# Patient Record
Sex: Female | Born: 1955 | Hispanic: Yes | Marital: Married | Smoking: Never smoker
Health system: Southern US, Community
[De-identification: ages and names within clinical notes are randomized; demographics above are authoritative.]

## PROBLEM LIST (undated history)

## (undated) DIAGNOSIS — I10 Essential (primary) hypertension: Secondary | ICD-10-CM

## (undated) DIAGNOSIS — E785 Hyperlipidemia, unspecified: Secondary | ICD-10-CM

## (undated) DIAGNOSIS — R87619 Unspecified abnormal cytological findings in specimens from cervix uteri: Secondary | ICD-10-CM

## (undated) HISTORY — DX: Unspecified abnormal cytological findings in specimens from cervix uteri: R87.619

---

## 2001-01-03 ENCOUNTER — Ambulatory Visit (HOSPITAL_COMMUNITY): Admission: RE | Admit: 2001-01-03 | Discharge: 2001-01-03 | Payer: Self-pay | Admitting: Interventional Radiology

## 2001-01-19 ENCOUNTER — Ambulatory Visit (HOSPITAL_COMMUNITY): Admission: RE | Admit: 2001-01-19 | Discharge: 2001-01-19 | Payer: Self-pay | Admitting: Interventional Radiology

## 2003-03-17 ENCOUNTER — Encounter: Admission: RE | Admit: 2003-03-17 | Discharge: 2003-03-17 | Payer: Self-pay | Admitting: *Deleted

## 2003-04-07 ENCOUNTER — Encounter: Admission: RE | Admit: 2003-04-07 | Discharge: 2003-04-07 | Payer: Self-pay | Admitting: Obstetrics and Gynecology

## 2003-04-07 ENCOUNTER — Other Ambulatory Visit: Admission: RE | Admit: 2003-04-07 | Discharge: 2003-04-07 | Payer: Self-pay | Admitting: Family Medicine

## 2003-07-22 ENCOUNTER — Encounter: Admission: RE | Admit: 2003-07-22 | Discharge: 2003-07-22 | Payer: Self-pay | Admitting: Family Medicine

## 2004-06-07 ENCOUNTER — Ambulatory Visit: Payer: Self-pay | Admitting: Obstetrics and Gynecology

## 2010-04-14 ENCOUNTER — Encounter: Payer: Self-pay | Admitting: Family Medicine

## 2010-04-14 ENCOUNTER — Encounter: Payer: Self-pay | Admitting: Interventional Radiology

## 2012-05-21 ENCOUNTER — Encounter (HOSPITAL_COMMUNITY): Payer: Self-pay | Admitting: *Deleted

## 2012-05-28 ENCOUNTER — Other Ambulatory Visit: Payer: Self-pay | Admitting: Obstetrics and Gynecology

## 2012-06-09 ENCOUNTER — Ambulatory Visit (HOSPITAL_COMMUNITY)
Admission: RE | Admit: 2012-06-09 | Discharge: 2012-06-09 | Disposition: A | Discharge status: Home / Self Care | Payer: Self-pay | Source: Ambulatory Visit | Attending: Obstetrics and Gynecology | Admitting: Obstetrics and Gynecology

## 2012-06-09 ENCOUNTER — Encounter (HOSPITAL_COMMUNITY): Payer: Self-pay

## 2012-06-09 VITALS — BP 176/108 | Temp 97.5°F | Ht 62.5 in | Wt 199.8 lb

## 2012-06-09 DIAGNOSIS — Z1239 Encounter for other screening for malignant neoplasm of breast: Secondary | ICD-10-CM

## 2012-06-09 DIAGNOSIS — Z1231 Encounter for screening mammogram for malignant neoplasm of breast: Secondary | ICD-10-CM

## 2012-06-09 HISTORY — DX: Hyperlipidemia, unspecified: E78.5

## 2012-06-09 HISTORY — DX: Essential (primary) hypertension: I10

## 2012-06-09 NOTE — Patient Instructions (Signed)
Taught patient how to perform BSE and gave educational materials to take home. Patient did not need a Pap smear today due to last Pap smear was May 11, 2012. Let her know [facility]CCCP will cover Pap smears every 3 years unless has a history of abnormal Pap smears. Let patient know will follow up with her within the next couple weeks with results by letter or phone. Patient verbalized understanding. Patient escorted to mammography for a screening mammogram.

## 2012-06-09 NOTE — Progress Notes (Signed)
Patient complains of no headache, no dizziness. Patient states has history of high blood pressure but has not taken meds in 2 years. Advised patient would need to be seen at urgent care today or ED for evaluation.

## 2012-06-09 NOTE — Progress Notes (Signed)
No complaints today.  Pap Smear:    Pap smear not completed today. Last Pap smear was 05/11/2012 at the free cervical cancer screening at the [facility] and normal. Per patient has no history of abnormal Pap smears. Pap smear result is scanned in EPIC under media.  Physical exam: Breasts Breasts symmetrical. No skin abnormalities bilateral breasts. No nipple retraction bilateral breasts. No nipple discharge bilateral breasts. No lymphadenopathy. No lumps palpated bilateral breasts. No complaints of pain or tenderness on exam. Patient escorted to mammography for a screening mammogram.        Pelvic/Bimanual No Pap smear completed today since last Pap smear was 05/11/2012. Pap smear not indicated per BCCCP guidelines.

## 2013-05-25 ENCOUNTER — Other Ambulatory Visit (HOSPITAL_COMMUNITY): Payer: Self-pay | Admitting: Family Medicine

## 2013-05-25 DIAGNOSIS — Z1231 Encounter for screening mammogram for malignant neoplasm of breast: Secondary | ICD-10-CM

## 2013-06-14 ENCOUNTER — Ambulatory Visit (HOSPITAL_COMMUNITY): Payer: Self-pay

## 2013-06-16 ENCOUNTER — Ambulatory Visit (HOSPITAL_COMMUNITY): Payer: Self-pay

## 2013-06-23 ENCOUNTER — Ambulatory Visit (HOSPITAL_COMMUNITY)
Admission: RE | Admit: 2013-06-23 | Discharge: 2013-06-23 | Disposition: A | Discharge status: Home / Self Care | Payer: Self-pay | Source: Ambulatory Visit | Attending: Family Medicine | Admitting: Family Medicine

## 2013-06-23 DIAGNOSIS — Z1231 Encounter for screening mammogram for malignant neoplasm of breast: Secondary | ICD-10-CM

## 2014-01-24 ENCOUNTER — Encounter (HOSPITAL_COMMUNITY): Payer: Self-pay

## 2014-03-25 HISTORY — PX: OTHER SURGICAL HISTORY: SHX169

## 2014-06-10 ENCOUNTER — Other Ambulatory Visit (HOSPITAL_COMMUNITY): Payer: Self-pay | Admitting: Family Medicine

## 2014-06-10 DIAGNOSIS — Z1231 Encounter for screening mammogram for malignant neoplasm of breast: Secondary | ICD-10-CM

## 2014-06-29 ENCOUNTER — Ambulatory Visit (HOSPITAL_COMMUNITY)
Admission: RE | Admit: 2014-06-29 | Discharge: 2014-06-29 | Disposition: A | Discharge status: Home / Self Care | Payer: Self-pay | Source: Ambulatory Visit | Attending: Family Medicine | Admitting: Family Medicine

## 2014-06-29 DIAGNOSIS — Z1231 Encounter for screening mammogram for malignant neoplasm of breast: Secondary | ICD-10-CM

## 2015-02-04 DIAGNOSIS — Z7409 Other reduced mobility: Secondary | ICD-10-CM | POA: Insufficient documentation

## 2015-02-19 DIAGNOSIS — S82852D Displaced trimalleolar fracture of left lower leg, subsequent encounter for closed fracture with routine healing: Secondary | ICD-10-CM | POA: Insufficient documentation

## 2015-02-22 DIAGNOSIS — R338 Other retention of urine: Secondary | ICD-10-CM | POA: Insufficient documentation

## 2015-09-28 DIAGNOSIS — M21072 Valgus deformity, not elsewhere classified, left ankle: Secondary | ICD-10-CM | POA: Insufficient documentation

## 2015-09-28 DIAGNOSIS — M12572 Traumatic arthropathy, left ankle and foot: Secondary | ICD-10-CM | POA: Insufficient documentation

## 2018-04-10 DIAGNOSIS — E785 Hyperlipidemia, unspecified: Secondary | ICD-10-CM | POA: Insufficient documentation

## 2018-04-10 DIAGNOSIS — Z789 Other specified health status: Secondary | ICD-10-CM | POA: Insufficient documentation

## 2018-04-10 DIAGNOSIS — I10 Essential (primary) hypertension: Secondary | ICD-10-CM | POA: Insufficient documentation

## 2018-05-11 DIAGNOSIS — R7303 Prediabetes: Secondary | ICD-10-CM | POA: Insufficient documentation

## 2018-11-04 DIAGNOSIS — D1621 Benign neoplasm of long bones of right lower limb: Secondary | ICD-10-CM | POA: Insufficient documentation

## 2018-11-04 DIAGNOSIS — M1711 Unilateral primary osteoarthritis, right knee: Secondary | ICD-10-CM | POA: Insufficient documentation

## 2019-06-10 ENCOUNTER — Ambulatory Visit: Payer: Self-pay | Attending: Internal Medicine

## 2019-06-10 DIAGNOSIS — Z23 Encounter for immunization: Secondary | ICD-10-CM

## 2019-06-10 NOTE — Progress Notes (Signed)
   Covid-19 Vaccination Clinic  Name:  Bary Castilla    MRN: [id number] DOB: [birth year]  06/10/2019  Ms. Netta Corrigan was observed post Covid-19 immunization for 15 minutes without incident. She was provided with Vaccine Information Sheet and instruction to access the V-Safe system.   Ms. Netta Corrigan was instructed to call 911 with any severe reactions post vaccine: Marland Kitchen Difficulty breathing  . Swelling of face and throat  . A fast heartbeat  . A bad rash all over body  . Dizziness and weakness   Immunizations Administered    Name Date Dose VIS Date Route   Pfizer COVID-19 Vaccine 06/10/2019  3:54 PM 0.3 mL 03/05/2019 Intramuscular   Manufacturer: [place]   Lot: [id number]   [place]: [id number]

## 2019-07-05 ENCOUNTER — Ambulatory Visit: Payer: Self-pay | Attending: Internal Medicine

## 2019-07-05 DIAGNOSIS — Z23 Encounter for immunization: Secondary | ICD-10-CM

## 2019-07-05 NOTE — Progress Notes (Signed)
   Covid-19 Vaccination Clinic  Name:  Dawn Cuevas    MRN: [id number] DOB: January 11, 1956  07/05/2019  Ms. Dawn Cuevas was observed post Covid-19 immunization for 15 minutes without incident. She was provided with Vaccine Information Sheet and instruction to access the V-Safe system.   Ms. Dawn Cuevas was instructed to call 911 with any severe reactions post vaccine: Marland Kitchen Difficulty breathing  . Swelling of face and throat  . A fast heartbeat  . A bad rash all over body  . Dizziness and weakness   Immunizations Administered    Name Date Dose VIS Date Route   Pfizer COVID-19 Vaccine 07/05/2019  3:53 PM 0.3 mL 03/05/2019 Intramuscular   Manufacturer: [place]   Lot: [id number]   [place]: [id number]

## 2020-04-29 DIAGNOSIS — Z20822 Contact with and (suspected) exposure to covid-19: Secondary | ICD-10-CM | POA: Diagnosis not present

## 2020-05-02 DIAGNOSIS — S60131A Contusion of right middle finger with damage to nail, initial encounter: Secondary | ICD-10-CM | POA: Diagnosis not present

## 2020-05-02 DIAGNOSIS — S60141A Contusion of right ring finger with damage to nail, initial encounter: Secondary | ICD-10-CM | POA: Diagnosis not present

## 2020-05-02 DIAGNOSIS — S6991XA Unspecified injury of right wrist, hand and finger(s), initial encounter: Secondary | ICD-10-CM | POA: Diagnosis not present

## 2020-05-03 ENCOUNTER — Other Ambulatory Visit: Payer: Self-pay

## 2020-05-03 ENCOUNTER — Ambulatory Visit: Payer: Medicare Other | Admitting: Obstetrics & Gynecology

## 2020-05-03 ENCOUNTER — Encounter: Payer: Self-pay | Admitting: Obstetrics & Gynecology

## 2020-05-03 ENCOUNTER — Other Ambulatory Visit (HOSPITAL_COMMUNITY)
Admission: RE | Admit: 2020-05-03 | Discharge: 2020-05-03 | Disposition: A | Discharge status: Home / Self Care | Payer: Medicare Other | Source: Ambulatory Visit | Attending: Obstetrics & Gynecology | Admitting: Obstetrics & Gynecology

## 2020-05-03 VITALS — BP 184/94 | HR 69 | Wt 200.0 lb

## 2020-05-03 DIAGNOSIS — N898 Other specified noninflammatory disorders of vagina: Secondary | ICD-10-CM | POA: Diagnosis present

## 2020-05-03 NOTE — Patient Instructions (Signed)
Vaginitis  Vaginitis is a condition in which the vaginal tissue swells and becomes irritated. This condition is most often caused by a change in the normal balance of bacteria and yeast that live in the vagina. This change causes an overgrowth of certain bacteria or yeast, which causes the inflammation. There are different types of vaginitis. What are the causes? The cause of this condition depends on the type of vaginitis. It can be caused by:  Bacteria (bacterial vaginosis).  Yeast, which is a fungus (candidiasis).  A parasite (trichomoniasis vaginitis).  A virus (viral vaginitis).  Low hormone levels (atrophic vaginitis). Low hormone levels can occur during pregnancy, breastfeeding, or after menopause.  Irritants, such as bubble baths, scented tampons, and feminine sprays (allergic vaginitis). Other factors can change the normal balance of the yeast and bacteria that live in the vagina. These include:  Antibiotic medicines.  Poor hygiene.  Diaphragms, vaginal sponges, spermicides, birth control pills, and intrauterine devices (IUDs).  Sex.  Infection.  Uncontrolled diabetes.  A weakened body defense system (immune system). What increases the risk? This condition is more likely to develop in women who:  Smoke or are exposed to secondhand smoke.  Use vaginal douches, scented tampons, or scented sanitary pads.  Wear tight-fitting pants or thong underwear.  Use oral birth control pills or an IUD.  Have sex without a condom or have multiple partners.  Have an STI.  Frequently use the spermicide nonoxynol-9.  Eat lots of foods high in sugar or who have uncontrolled diabetes.  Have low estrogen levels.  Have a weakened immune system from an immune disorder or medical treatment.  Are pregnant or breastfeeding. What are the signs or symptoms? Symptoms vary depending on the cause of the vaginitis. Common symptoms include:  Abnormal vaginal discharge. ? The  discharge is white, gray, or yellow with bacterial vaginosis. ? The discharge is thick, white, and cheesy with a yeast infection. ? The discharge is frothy and yellow or greenish with trichomoniasis.  A bad vaginal smell. The smell is fishy with bacterial vaginosis.  Vaginal itching, pain, or swelling.  Pain with sex.  Pain or burning when urinating. Sometimes there are no symptoms. How is this diagnosed? This condition is diagnosed based on your symptoms and medical history. A physical exam, including a pelvic exam, will also be done. You may also have other tests, including:  Tests to determine the pH level (acidity or alkalinity) of your vagina.  A whiff test to assess the odor that results when a sample of your vaginal discharge is mixed with a potassium hydroxide solution.  Tests of vaginal fluid. A sample will be examined under a microscope. How is this treated? Treatment varies depending on the type of vaginitis you have. Your treatment may include:  Antibiotic creams or pills to treat bacterial vaginosis and trichomoniasis.  Antifungal medicines, such as vaginal creams or suppositories, to treat a yeast infection.  Medicine to ease discomfort if you have viral vaginitis. Your sexual partner should also be treated.  Estrogen delivered in a cream, pill, suppository, or vaginal ring to treat atrophic vaginitis. If vaginal dryness occurs, lubricants and moisturizing creams may help. You may need to avoid scented soaps, sprays, or douches.  Stopping use of a product that is causing allergic vaginitis and then using a vaginal cream to treat the symptoms. Follow these instructions at home: Lifestyle  Keep your genital area clean and dry. Avoid soap, and only rinse the area with water.  Do not douche  or use tampons until your health care provider says it is okay. Use sanitary pads, if needed.  Do not have sex until your health care provider approves. When you can return to sex,  practice safe sex and use condoms.  Wipe from front to back. This avoids the spread of bacteria from the rectum to the vagina. General instructions  Take over-the-counter and prescription medicines only as told by your health care provider.  If you were prescribed an antibiotic medicine, take or use it as told by your health care provider. Do not stop taking or using the antibiotic even if you start to feel better.  Keep all follow-up visits. This is important. How is this prevented?  Use mild, unscented products. Do not use things that can irritate the vagina, such as fabric softeners. Avoid the following products if they are scented: ? Feminine sprays. ? Detergents. ? Tampons. ? Feminine hygiene products. ? Soaps or bubble baths.  Let air reach your genital area. To do this: ? Wear cotton underwear to reduce moisture buildup. ? Avoid wearing underwear while you sleep. ? Avoid wearing tight pants and underwear or nylons without a cotton panel. ? Avoid wearing thong underwear.  Take off any wet clothing, such as bathing suits, as soon as possible.  Practice safe sex and use condoms. Contact a health care provider if:  You have abdominal or pelvic pain.  You have a fever or chills.  You have symptoms that last for more than 2-3 days. Get help right away if:  You have a fever and your symptoms suddenly get worse. Summary  Vaginitis is a condition in which the vaginal tissue becomes inflamed.This condition is most often caused by a change in the normal balance of bacteria and yeast that live in the vagina.  Treatment varies depending on the type of vaginitis you have.  Do not douche, use tampons, or have sex until your health care provider approves. When you can return to sex, practice safe sex and use condoms. This information is not intended to replace advice given to you by your health care provider. Make sure you discuss any questions you have with your health care  provider. Document Revised: 09/09/2019 Document Reviewed: 09/09/2019 Elsevier Patient Education  [place].

## 2020-05-03 NOTE — Progress Notes (Signed)
Patient presents to establish care but thinks she has a vaginal infection.  Patient reports some irritation and burning for several months, especially after urination.  Patient states her blood pressure is high due to being she had a hospital visit yesterday for her finger injury. Patient states she has pap smear scheduled with [facility]- patient wanting second opinion on the the vaginal infection is why she presents today to our clinic.   Kathrene Alu RN

## 2020-05-03 NOTE — Progress Notes (Signed)
GYNECOLOGY OFFICE VISIT NOTE  History:   Dawn Cuevas is a 65 y.o. [id number] here today for second opinion regarding etiology of chronic vaginal irritation and vaginal odor for several years.  Patient is Spanish-speaking only, interpreter present for this encounter. Has history of urinary incontinence issues and followed by Urology, reports external vulvar irritation especially after urinating a lot. Also noticed vaginal odor. Had been evaluated by her GYN, who feels it has to do with her urinary issues. She desires a second opinion today, wants to be evaluated for vaginitis. Avoids washing with any soap, only washes with water. Wears cotton underwear only, wipes front to back etc.  She denies any abnormal vaginal discharge, bleeding, pelvic pain or other concerns.    Past Medical History:  Diagnosis Date  . Abnormal Pap smear of cervix   . Hyperlipemia   . Hypertension     History reviewed. No pertinent surgical history.  The following portions of the patient's history were reviewed and updated as appropriate: allergies, current medications, past family history, past medical history, past social history, past surgical history and problem list.   Health Maintenance:  Normal pap and negative HRHPV  In 2021 as per patient.  Normal mammogram on 04/2019.   Review of Systems:  Pertinent items noted in HPI and remainder of comprehensive ROS otherwise negative.  Physical Exam:  BP (!) 184/94   Pulse 69   Wt 200 lb (90.7 kg)   BMI 36.00 kg/m  CONSTITUTIONAL: Well-developed, well-nourished female in no acute distress.  HEENT:  Normocephalic, atraumatic. External right and left ear normal. No scleral icterus.  NECK: Normal range of motion, supple, no masses noted on observation SKIN: No rash noted. Not diaphoretic. No erythema. No pallor. MUSCULOSKELETAL: Normal range of motion. No edema noted. NEUROLOGIC: Alert and oriented to person, place, and time. Normal muscle tone coordination. No  cranial nerve deficit noted. PSYCHIATRIC: Normal mood and affect. Normal behavior. Normal judgment and thought content. CARDIOVASCULAR: Normal heart rate noted RESPIRATORY: Effort and breath sounds normal, no problems with respiration noted ABDOMEN: No masses noted. No other overt distention noted.   PELVIC: Normal appearing external genitalia with mild atrophy; normal urethral meatus; normal appearing vaginal mucosa and cervix with mild-moderate atrophy noted. Patient pointed to posterior fourchette area as region of vulvar irritation after urination.  Scant clear discharge noted, testing sample obtained.  Normal uterine size, no other palpable masses, no uterine or adnexal tenderness. Performed in the presence of a chaperone.     Assessment and Plan:      1. Vaginal irritation 2. Vaginal odor No obvious gynecologic etiology seen, vaginitis testing done. Will follow up results and manage accordingly. Irritation and odor likely due to frequent urination, advised patient to keep dry as much as possible and to use hydrocortisone cream externally as needed for irritation.  All questions answered. - Cervicovaginal ancillary only( [place]) Routine preventative health maintenance measures emphasized, will follow up with GYN provider for upcoming pap smear appointment or can make follow up appointment with our office. Please refer to After Visit Summary for other counseling recommendations.   Return for any gynecologic concerns.    I spent 25 minutes dedicated to the care of this patient including pre-visit review of records, face to face time with the patient discussing her conditions and treatments and post visit ordering of testing.   Verita Schneiders, MD, [place] for [employer], [place]

## 2020-05-04 LAB — CERVICOVAGINAL ANCILLARY ONLY
Bacterial Vaginitis (gardnerella): POSITIVE — AB
Candida Glabrata: NEGATIVE
Candida Vaginitis: NEGATIVE
Chlamydia: NEGATIVE
Comment: NEGATIVE
Comment: NEGATIVE
Comment: NEGATIVE
Comment: NEGATIVE
Comment: NEGATIVE
Comment: NORMAL
Neisseria Gonorrhea: NEGATIVE
Trichomonas: NEGATIVE

## 2020-05-05 ENCOUNTER — Telehealth: Payer: Self-pay

## 2020-05-05 MED ORDER — METRONIDAZOLE 500 MG PO TABS: 500.0000 mg | ORAL_TABLET | Freq: Two times a day (BID) | ORAL | 0 refills | Status: DC

## 2020-05-05 NOTE — Telephone Encounter (Signed)
Patient called using pacific interpreter telephonic [phone] [facility].   Patient called and made aware of bacterial vaginosis. Patient made aware to take flagyl twice a day for seven days. Patient pharmacy checked. Kathrene Alu RN

## 2020-09-06 DIAGNOSIS — K219 Gastro-esophageal reflux disease without esophagitis: Secondary | ICD-10-CM | POA: Diagnosis not present

## 2020-09-06 DIAGNOSIS — M12572 Traumatic arthropathy, left ankle and foot: Secondary | ICD-10-CM | POA: Diagnosis not present

## 2020-09-06 DIAGNOSIS — R399 Unspecified symptoms and signs involving the genitourinary system: Secondary | ICD-10-CM | POA: Diagnosis not present

## 2020-09-06 DIAGNOSIS — E782 Mixed hyperlipidemia: Secondary | ICD-10-CM | POA: Diagnosis not present

## 2020-09-06 DIAGNOSIS — I10 Essential (primary) hypertension: Secondary | ICD-10-CM | POA: Diagnosis not present

## 2020-09-06 DIAGNOSIS — M2550 Pain in unspecified joint: Secondary | ICD-10-CM | POA: Diagnosis not present

## 2020-09-06 DIAGNOSIS — Z7689 Persons encountering health services in other specified circumstances: Secondary | ICD-10-CM | POA: Diagnosis not present

## 2021-01-05 DIAGNOSIS — E782 Mixed hyperlipidemia: Secondary | ICD-10-CM | POA: Diagnosis not present

## 2021-01-05 DIAGNOSIS — J3089 Other allergic rhinitis: Secondary | ICD-10-CM | POA: Diagnosis not present

## 2021-01-05 DIAGNOSIS — K2960 Other gastritis without bleeding: Secondary | ICD-10-CM | POA: Diagnosis not present

## 2021-01-05 DIAGNOSIS — I10 Essential (primary) hypertension: Secondary | ICD-10-CM | POA: Diagnosis not present

## 2021-01-05 DIAGNOSIS — R053 Chronic cough: Secondary | ICD-10-CM | POA: Diagnosis not present

## 2021-01-05 DIAGNOSIS — Z0000 Encounter for general adult medical examination without abnormal findings: Secondary | ICD-10-CM | POA: Diagnosis not present

## 2021-01-05 DIAGNOSIS — Z79899 Other long term (current) drug therapy: Secondary | ICD-10-CM | POA: Diagnosis not present

## 2021-01-05 DIAGNOSIS — K219 Gastro-esophageal reflux disease without esophagitis: Secondary | ICD-10-CM | POA: Diagnosis not present

## 2021-01-08 DIAGNOSIS — E782 Mixed hyperlipidemia: Secondary | ICD-10-CM | POA: Diagnosis not present

## 2021-01-08 DIAGNOSIS — K2950 Unspecified chronic gastritis without bleeding: Secondary | ICD-10-CM | POA: Diagnosis not present

## 2021-01-08 DIAGNOSIS — R059 Cough, unspecified: Secondary | ICD-10-CM | POA: Diagnosis not present

## 2021-01-08 DIAGNOSIS — R053 Chronic cough: Secondary | ICD-10-CM | POA: Diagnosis not present

## 2021-01-08 DIAGNOSIS — I10 Essential (primary) hypertension: Secondary | ICD-10-CM | POA: Diagnosis not present

## 2021-01-08 DIAGNOSIS — J3089 Other allergic rhinitis: Secondary | ICD-10-CM | POA: Diagnosis not present

## 2021-01-08 DIAGNOSIS — Z79899 Other long term (current) drug therapy: Secondary | ICD-10-CM | POA: Diagnosis not present

## 2021-01-08 DIAGNOSIS — K219 Gastro-esophageal reflux disease without esophagitis: Secondary | ICD-10-CM | POA: Insufficient documentation

## 2021-01-08 DIAGNOSIS — Z23 Encounter for immunization: Secondary | ICD-10-CM | POA: Diagnosis not present

## 2021-01-08 DIAGNOSIS — Z0000 Encounter for general adult medical examination without abnormal findings: Secondary | ICD-10-CM | POA: Diagnosis not present

## 2021-03-01 ENCOUNTER — Other Ambulatory Visit (HOSPITAL_BASED_OUTPATIENT_CLINIC_OR_DEPARTMENT_OTHER): Payer: Self-pay | Admitting: Orthopaedic Surgery

## 2021-03-01 DIAGNOSIS — M25572 Pain in left ankle and joints of left foot: Secondary | ICD-10-CM

## 2021-03-02 ENCOUNTER — Ambulatory Visit (HOSPITAL_BASED_OUTPATIENT_CLINIC_OR_DEPARTMENT_OTHER)
Admission: RE | Admit: 2021-03-02 | Discharge: 2021-03-02 | Disposition: A | Discharge status: Home / Self Care | Payer: Medicare (Managed Care) | Source: Ambulatory Visit | Attending: Orthopaedic Surgery | Admitting: Orthopaedic Surgery

## 2021-03-02 ENCOUNTER — Ambulatory Visit (INDEPENDENT_AMBULATORY_CARE_PROVIDER_SITE_OTHER): Payer: Medicare (Managed Care) | Admitting: Orthopaedic Surgery

## 2021-03-02 ENCOUNTER — Ambulatory Visit: Payer: Medicare (Managed Care) | Admitting: Orthopaedic Surgery

## 2021-03-02 ENCOUNTER — Other Ambulatory Visit: Payer: Self-pay

## 2021-03-02 DIAGNOSIS — M7989 Other specified soft tissue disorders: Secondary | ICD-10-CM | POA: Diagnosis not present

## 2021-03-02 DIAGNOSIS — M19172 Post-traumatic osteoarthritis, left ankle and foot: Secondary | ICD-10-CM

## 2021-03-02 DIAGNOSIS — M25572 Pain in left ankle and joints of left foot: Secondary | ICD-10-CM | POA: Diagnosis not present

## 2021-03-02 NOTE — Progress Notes (Signed)
Chief Complaint: Left ankle pain     History of Present Illness:    Dawn Cuevas is a 65 y.o. female presents with 5 years of left ankle pain.  She states that at that time she had an ankle fracture and was treated with an external fixator.  She subsequently in the last several weeks has developed soreness and achiness around the ankle.  She has done physical therapy in the past following her ankle fracture but has not subsequently done it.  She has been using an over-the-counter lace up brace which provides stability but limits her motion.  At today's visit she predominately has the biggest issue with her range of motion.  The pain she states is very tolerable but she is asking about options for range of motion.  Interview was conducted with the Spanish interpreter    Surgical History:   Left ankle fracture treated definitively and external fixator 5 years prior Dr. Marjory Lies  PMH/PSH/Family History/Social History/Meds/Allergies:    Past Medical History:  Diagnosis Date   Abnormal Pap smear of cervix    Hyperlipemia    Hypertension    No past surgical history on file. Social History   Socioeconomic History   Marital status: Married    Spouse name: Not on file   Number of children: Not on file   Years of education: Not on file   Highest education level: Not on file  Occupational History   Not on file  Tobacco Use   Smoking status: Never   Smokeless tobacco: Never  Vaping Use   Vaping Use: Never used  Substance and Sexual Activity   Alcohol use: No   Drug use: No   Sexual activity: Yes    Birth control/protection: None  Other Topics Concern   Not on file  Social History Narrative   Not on file   Social Determinants of Health   Financial Resource Strain: Not on file  Food Insecurity: Not on file  Transportation Needs: Not on file  Physical Activity: Not on file  Stress: Not on file  Social Connections: Not on file   Family  History  Problem Relation Age of Onset   Heart disease Mother    Diabetes Father    Hypertension Father    No Known Allergies Current Outpatient Medications  Medication Sig Dispense Refill   amLODipine (NORVASC) 10 MG tablet Take 10 mg by mouth daily.     carvedilol (COREG) 25 MG tablet Take 25 mg by mouth 2 (two) times daily.     metroNIDAZOLE (FLAGYL) 500 MG tablet Take 1 tablet (500 mg total) by mouth 2 (two) times daily. 14 tablet 0   No current facility-administered medications for this visit.   No results found.  Review of Systems:   A ROS was performed including pertinent positives and negatives as documented in the HPI.  Physical Exam :   Constitutional: NAD and appears stated age Neurological: Alert and oriented Psych: Appropriate affect and cooperative There were no vitals taken for this visit.   Comprehensive Musculoskeletal Exam:    Left ankle is tender about the medial and lateral malleolus as well as the ankle joint.  Range of motion is approximately 5 degrees of dorsi and plantar flexion.  She does have good inversion of and eversion which is near equal  to the contralateral side.  Minimal pain with inversion and eversion.  She has tenderness and trace swelling about the ankle joint itself.  No obvious deformity.  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  At this point she states that her pain is actually quite good and that her motion is what is bothering her the most.  As result I like to plan to send her to physical therapy to work on ankle strengthening and range of motion.  That being said I did also discuss surgical options.  Given the  fact that she has end-stage osteoarthritis options would be ankle arthroplasty versus fusion.  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  To that effect I think an ankle arthroplasty would be potentially the only surgical option although given her subtalar arthritis I am not sure if she would be a candidate for this.  We will start with physical therapy and see how this does.  If she desires a surgical option we would plan on possible referral to Dr. Doran Durand to assess candidacy for total ankle arthroplasty  Plan :    -Physical therapy ordered for left ankle strengthening range of motion -She will follow-up as needed     I personally saw and evaluated the patient, and participated in the management and treatment plan.  Vanetta Mulders, MD Attending Physician, Orthopedic Surgery  This document was dictated using Dragon voice recognition software. A reasonable attempt at proof reading has been made to minimize errors.

## 2021-03-27 NOTE — Therapy (Signed)
OUTPATIENT PHYSICAL THERAPY LOWER EXTREMITY EVALUATION   Patient Name: Dawn Cuevas MRN: [id number] DOB:1956-01-10, 66 y.o., female Today's Date: 03/27/2021    Past Medical History:  Diagnosis Date   Abnormal Pap smear of cervix    Hyperlipemia    Hypertension    No past surgical history on file. There are no problems to display for this patient.   PCP: Gara Kroner, DO  REFERRING PROVIDER: Vanetta Mulders, MD  REFERRING DIAG: [phone] (ICD-10-CM) - Pain of joint of left ankle and foot  THERAPY DIAG:  No diagnosis found.  ONSET DATE: 2016  SUBJECTIVE: Pt requires interpreter.   SUBJECTIVE STATEMENT: Pt states the L ankle is still stiff but not as much pain.   After I got out of the hospital, she had lateral ankle pain. Pt states she had PT afterwards. After a year, the pain is a burning sensation on the inside of the ankle. The pain will rotate around her ankle is concentrated to the "size of a penny."   Pt feels she is most limited due to pain within the front of the ankle. She will also feel pain into the Achilles tendon. Pt states pain is only with walking. Pt can walk for about an hour before she starts noticing the pain. Pt states the Achilles pain started a year ago.    Pt states she has been doing calf strengthening exercises at home.   PERTINENT HISTORY: L ankle fracture  PAIN:  Are you having pain? No VAS scale: 0/10 Pain location: L ankle PAIN TYPE: burning Pain description: intermittent  Aggravating factors: walking Relieving factors: ice/heat  PRECAUTIONS: None  WEIGHT BEARING RESTRICTIONS No  FALLS:  Has patient fallen in last 6 months? No, Number of falls: 0  LIVING ENVIRONMENT: Lives with: lives with their family and lives with their spouse Lives in: House/apartment Stairs: Yes; 5 steps Has following equipment at home: Cane  OCCUPATION: not currently   PLOF: Independent  PATIENT GOALS:    OBJECTIVE:   DIAGNOSTIC  FINDINGS:     FINDINGS: There is diffuse soft tissue swelling surrounding the ankle. There is no acute fracture or dislocation identified. There is severe talotibial joint space narrowing with bone-on-bone configuration and sclerosis. Small osteophyte formation present.   IMPRESSION: 1. 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 2. No acute fracture. 3. Soft tissue swelling.    PATIENT SURVEYS:  FOTO 40 D/C 65 11 pts MCII  COGNITION:  Overall cognitive status: Within functional limits for tasks assessed     SENSATION:  Light touch: Appears intact   POSTURE:  Toe out in standing on L, calcaneal valgus  PALPATION: TTP Achilles, tarsal tunnel, and fibularis group  LE AROM/PROM:  A/PROM Right 03/27/2021 Left 03/27/2021  Ankle dorsiflexion WFL -5  Ankle plantarflexion WFL 50  Ankle inversion WFL 20  Ankle eversion WFL 15   (Blank rows = not tested)  LE MMT:  MMT Right 03/27/2021 Left 03/27/2021  Ankle DF 5/5 4+/5 p!  Ankle plantarflexion 5/5 4+/5 p!  Ankle inversion 55 4+/5p!  Ankle eversion 4+/5 4+/5p!   (Blank rows = not tested)  FUNCTIONAL TESTS:  5 times sit to stand: UE required  GAIT: Distance walked: 50ft Assistive device utilized: None Level of assistance: Complete Independence Comments: decreased DF, decreased stance time, lack of distinct heel strike, compensated Trendelenburg    TODAY'S TREATMENT:   Exercises Standing Heel Raise with Support - 2 x daily - 7 x weekly - 2 sets - 10 reps  Gastroc Stretch on Wall - 2 x daily - 7 x weekly - 1 sets - 3 reps - 30 hold Tandem Stance - 2 x daily - 7 x weekly - 1 sets - 4 reps - 30 hold   PATIENT EDUCATION:  Education details: MOI, diagnosis, prognosis, anatomy, exercise progression, DOMS expectations, muscle firing,  envelope of function, HEP, POC  Person educated: Patient Education method: Explanation, Demonstration, Tactile cues, Verbal cues, and Handouts Education comprehension:  verbalized understanding, returned demonstration, verbal cues required, and tactile cues required   HOME EXERCISE PROGRAM: Access Code: [email or web address] URL: [email or web address] Date: 03/28/2021 Prepared by: Daleen Bo  ASSESSMENT:  CLINICAL IMPRESSION: Patient is a 66 y.o. Spanish speaking female who was seen today for physical therapy evaluation and treatment for cc of L ankle pain/stiffness. Pt's s/s appear consistent with history of L ankle fracture as well as Achilles and posterior tibialis tendinopathy. Pt's pain is moderately sensitive and irritable. Objective impairments include Abnormal gait, decreased activity tolerance, decreased balance, decreased mobility, difficulty walking, decreased ROM, decreased strength, hypomobility, increased fascial restrictions, increased muscle spasms, impaired flexibility, improper body mechanics, and pain. These impairments are limiting patient from cleaning, community activity, driving, meal prep, occupation, laundry, yard work, shopping, and recreation/exercise . Personal factors including Age, Behavior pattern, Fitness, and Time since onset of injury/illness/exacerbation are also affecting patient's functional outcome. Patient will benefit from skilled PT to address above impairments and improve overall function.  REHAB POTENTIAL: Fair    CLINICAL DECISION MAKING: Stable/uncomplicated  EVALUATION COMPLEXITY: Low   GOALS:   SHORT TERM GOALS:  STG Name Target Date Goal status  1 Pt will become independent with HEP in order to demonstrate synthesis of PT education.  Baseline:  05/08/2021 INITIAL  2 Pt will score at least 11 pt increase on FOTO to demonstrate functional improvement in MCII and pt perceived function.   Baseline:  05/08/2021 INITIAL  3 Pt will be able to demonstrate neutral DF of L ankle in CKC in order to demonstrate functional improvement in LE function for self-care and house hold duties.  Baseline: 05/08/2021 INITIAL    LONG TERM GOALS:   LTG Name Target Date Goal status  1 Pt  will become independent with final HEP in order to demonstrate synthesis of PT education.  Baseline: 06/19/2021 INITIAL  2 Pt will score >/= 65 on FOTO to demonstrate improvement in perceived ankle function.  Baseline: 06/19/2021 INITIAL  3 Pt will be able to demonstrate/report ability to walk >15 mins without pain in order to demonstrate functional improvement and tolerance to exercise and community mobility.   Baseline: 06/19/2021 INITIAL   PLAN: PT FREQUENCY: 1-2x/week  PT DURATION: 12 weeks (likely D/C by 8)  PLANNED INTERVENTIONS: Therapeutic exercises, Therapeutic activity, Neuro Muscular re-education, Balance training, Gait training, Patient/Family education, Joint mobilization, Stair training, Prosthetic training, Aquatic Therapy, Dry Needling, Electrical stimulation, Spinal mobilization, Cryotherapy, Moist heat, scar mobilization, Taping, Vasopneumatic device, Traction, Ultrasound, Ionotophoresis 4mg /ml Dexamethasone, and Manual therapy  PLAN FOR NEXT SESSION: review HEP, wood pecker, ankle IV EV banded  Daleen Bo PT, DPT 03/27/21 5:32 PM

## 2021-03-28 ENCOUNTER — Encounter (HOSPITAL_BASED_OUTPATIENT_CLINIC_OR_DEPARTMENT_OTHER): Payer: Self-pay | Admitting: Physical Therapy

## 2021-03-28 ENCOUNTER — Other Ambulatory Visit: Payer: Self-pay

## 2021-03-28 ENCOUNTER — Ambulatory Visit (HOSPITAL_BASED_OUTPATIENT_CLINIC_OR_DEPARTMENT_OTHER): Payer: Medicare (Managed Care) | Attending: Orthopaedic Surgery | Admitting: Physical Therapy

## 2021-03-28 DIAGNOSIS — M6281 Muscle weakness (generalized): Secondary | ICD-10-CM | POA: Diagnosis not present

## 2021-03-28 DIAGNOSIS — R262 Difficulty in walking, not elsewhere classified: Secondary | ICD-10-CM

## 2021-03-28 DIAGNOSIS — M25572 Pain in left ankle and joints of left foot: Secondary | ICD-10-CM | POA: Diagnosis not present

## 2021-03-28 DIAGNOSIS — M25672 Stiffness of left ankle, not elsewhere classified: Secondary | ICD-10-CM | POA: Diagnosis not present

## 2021-04-16 ENCOUNTER — Encounter (HOSPITAL_BASED_OUTPATIENT_CLINIC_OR_DEPARTMENT_OTHER): Payer: Self-pay | Admitting: Physical Therapy

## 2021-04-16 ENCOUNTER — Ambulatory Visit (HOSPITAL_BASED_OUTPATIENT_CLINIC_OR_DEPARTMENT_OTHER): Payer: Medicare (Managed Care) | Admitting: Physical Therapy

## 2021-04-16 ENCOUNTER — Other Ambulatory Visit: Payer: Self-pay

## 2021-04-16 DIAGNOSIS — M25572 Pain in left ankle and joints of left foot: Secondary | ICD-10-CM

## 2021-04-16 DIAGNOSIS — R262 Difficulty in walking, not elsewhere classified: Secondary | ICD-10-CM

## 2021-04-16 DIAGNOSIS — M25672 Stiffness of left ankle, not elsewhere classified: Secondary | ICD-10-CM

## 2021-04-16 DIAGNOSIS — M6281 Muscle weakness (generalized): Secondary | ICD-10-CM

## 2021-04-16 NOTE — Therapy (Signed)
OUTPATIENT PHYSICAL THERAPY TREATMENT NOTE   Patient Name: Dawn Cuevas MRN: [id number] DOB:1956/01/03, 66 y.o., female Today's Date: 04/16/2021  PCP: Gara Kroner, DO   PT End of Session - 04/16/21 1711     Visit Number 2    Number of Visits 11    Date for PT Re-Evaluation 06/26/21    Authorization Type [employer]    PT Start Time 1603    PT Stop Time 1648    PT Time Calculation (min) 45 min    Activity Tolerance Patient tolerated treatment well    Behavior During Therapy [facility] for tasks assessed/performed             Past Medical History:  Diagnosis Date   Abnormal Pap smear of cervix    Hyperlipemia    Hypertension    Past Surgical History:  Procedure Laterality Date   L ankle external fixation Left 2016   Pt report through interpreter   There are no problems to display for this patient.   REFERRING PROVIDER: Vanetta Mulders, MD   REFERRING DIAG: [phone] (ICD-10-CM) - Pain of joint of left ankle and foot   THERAPY DIAG:  No diagnosis found.   ONSET DATE: 2016   SUBJECTIVE: Pt requires interpreter.    SUBJECTIVE STATEMENT: Pt denies any adverse effects after prior Rx and states she felt better after prior Rx.    Pt c/o's of ankle stiffness especially in the AM.  Pt reports she performs HEP though has pain with HEP.  Pt reports having increased pain with walking.  Pt uses a brace on L ankle.     PERTINENT HISTORY: L ankle fracture   PAIN:  Are you having pain? No NPRS scale: 5/10 Pain location: L lateral ankle and post ankle/achilles PAIN TYPE: burning Pain description: intermittent  Aggravating factors: walking Relieving factors: ice/heat   PRECAUTIONS: None   WEIGHT BEARING RESTRICTIONS No      OCCUPATION: not currently    PLOF: Independent        OBJECTIVE:    DIAGNOSTIC FINDINGS:  FINDINGS: There is diffuse soft tissue swelling surrounding the ankle. There is no acute fracture or dislocation identified. There is  severe talotibial joint space narrowing with bone-on-bone configuration and sclerosis. Small osteophyte formation present.   IMPRESSION: 1. 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 2. No acute fracture. 3. Soft tissue swelling.      OBSERVATION:  Pt wearing ankle brace on L and uses a quad cane.   L ankle DF AROM: 0 deg         TODAY'S TREATMENT:  -Reviewed response to prior Rx HEP compliance, current function, and pain level.  -Pt received L ankle PROM in DF, PF, Inv, and Eve. -Pt performed:   Longsitting gastroc stretch 3x20-30 sec  Standing wall gastroc stretch 2x30 sec  Standing heel raises 2x10 reps with UE assist  Modified tandem stance x30 sec bilat    Standing on airex x30 sec with FA and FT 2x30 sec  Ankle T band   Attempted DF though pt had increased pain.   Attempted PF with GTB, RTB, and YTB though pt had increased pain   Inv with YTB x 15 reps   Eve with RTB x 15 reps  Ankle circles x10 reps cw and ccw       PATIENT EDUCATION:  Education details:   Exercise form including HEP.  Instructed to not lean as far with calf stretch if having pain.  POC  Person educated: Patient Education method: Explanation, Demonstration, Tactile cues, Verbal cues, and Handouts Education comprehension: verbalized understanding, returned demonstration, verbal cues required, and tactile cues required     HOME EXERCISE PROGRAM: Access Code: [email or web address] URL: [email or web address] Date: 03/28/2021 Prepared by: Daleen Bo  Exercises Standing Heel Raise with Support - 2 x daily - 7 x weekly - 2 sets - 10 reps Gastroc Stretch on Wall - 2 x daily - 7 x weekly - 1 sets - 3 reps - 30 hold Tandem Stance - 2 x daily - 7 x weekly - 1 sets - 4 reps - 30 hold   ASSESSMENT:   CLINICAL IMPRESSION: Pt demonstrates improved DF ROM.  Pt reports having pain with HEP and the majority of her exercises during Rx.  PT reviewed HEP and instructed pt in correct form with  HEP.  Pt had improved sx's when not leaning as far fwd with standing gastroc stretch.  Pt reports improved balance with modified tandem stance.  Pt had increased pain with standing heel raises.  PT attempted T band PF with different resistance levels though pt had increased pain with all levels. Pt had worse pain with T band PF than standing heel raises.  Pt is able to perform resisted eversion with better form and ease than inversion.  Pt is limited with exercises due to pain though did report improved pain from 5/10 before Rx to 4/10 after Rx.  Pt should benefit from skilled PT to address above impairments and improve overall function.    Objective impairments include Abnormal gait, decreased activity tolerance, decreased balance, decreased mobility, difficulty walking, decreased ROM, decreased strength, hypomobility, increased fascial restrictions, increased muscle spasms, impaired flexibility, improper body mechanics, and pain.  These impairments are limiting patient from cleaning, community activity, driving, meal prep, occupation, laundry, yard work, shopping, and recreation/exercise . Personal factors including Age, Behavior pattern, Fitness, and Time since onset of injury/illness/exacerbation are also affecting patient's functional outcome.   REHAB POTENTIAL: Fair     CLINICAL DECISION MAKING: Stable/uncomplicated   EVALUATION COMPLEXITY: Low     GOALS:     SHORT TERM GOALS:   STG Name Target Date Goal status  1 Pt will become independent with HEP in order to demonstrate synthesis of PT education.   Baseline:  05/08/2021 INITIAL  2 Pt will score at least 11 pt increase on FOTO to demonstrate functional improvement in MCII and pt perceived function.    Baseline:  05/08/2021 INITIAL  3 Pt will be able to demonstrate neutral DF of L ankle in CKC in order to demonstrate functional improvement in LE function for self-care and house hold duties.   Baseline: 05/08/2021 INITIAL    LONG TERM  GOALS:    LTG Name Target Date Goal status  1 Pt  will become independent with final HEP in order to demonstrate synthesis of PT education.   Baseline: 06/19/2021 INITIAL  2 Pt will score >/= 65 on FOTO to demonstrate improvement in perceived ankle function.   Baseline: 06/19/2021 INITIAL  3 Pt will be able to demonstrate/report ability to walk >15 mins without pain in order to demonstrate functional improvement and tolerance to exercise and community mobility.    Baseline: 06/19/2021 INITIAL    PLAN: PT FREQUENCY: 1-2x/week   PT DURATION: 12 weeks (likely D/C by 8)   PLANNED INTERVENTIONS: Therapeutic exercises, Therapeutic activity, Neuro Muscular re-education, Balance training, Gait training, Patient/Family education, Joint mobilization, Stair training, Prosthetic training, Aquatic Therapy, Dry Needling, Electrical stimulation, Spinal  mobilization, Cryotherapy, Moist heat, scar mobilization, Taping, Vasopneumatic device, Traction, Ultrasound, Ionotophoresis 4mg /ml Dexamethasone, and Manual therapy   PLAN FOR NEXT SESSION: review HEP, wood pecker, ankle IV EV banded  Selinda Michaels III PT, DPT 04/16/21 5:15 PM

## 2021-05-02 ENCOUNTER — Encounter (HOSPITAL_BASED_OUTPATIENT_CLINIC_OR_DEPARTMENT_OTHER): Payer: Self-pay | Admitting: Physical Therapy

## 2021-05-02 ENCOUNTER — Encounter: Payer: Self-pay | Admitting: Physical Therapy

## 2021-05-02 ENCOUNTER — Other Ambulatory Visit: Payer: Self-pay

## 2021-05-02 ENCOUNTER — Ambulatory Visit (HOSPITAL_BASED_OUTPATIENT_CLINIC_OR_DEPARTMENT_OTHER): Payer: Medicare (Managed Care) | Attending: Orthopaedic Surgery | Admitting: Physical Therapy

## 2021-05-02 DIAGNOSIS — M25672 Stiffness of left ankle, not elsewhere classified: Secondary | ICD-10-CM | POA: Insufficient documentation

## 2021-05-02 DIAGNOSIS — R262 Difficulty in walking, not elsewhere classified: Secondary | ICD-10-CM | POA: Diagnosis present

## 2021-05-02 DIAGNOSIS — M25572 Pain in left ankle and joints of left foot: Secondary | ICD-10-CM | POA: Diagnosis present

## 2021-05-02 DIAGNOSIS — M6281 Muscle weakness (generalized): Secondary | ICD-10-CM | POA: Diagnosis present

## 2021-05-02 NOTE — Therapy (Signed)
OUTPATIENT PHYSICAL THERAPY PROGRESS NOTE Progress Note Reporting Period 03/28/21 to 05/02/21  See note below for Objective Data and Assessment of Progress/Goals.       Patient Name: Dawn Cuevas MRN: [id number] DOB:1955/12/13, 66 y.o., female Today's Date: 05/02/2021  PCP: Gara Kroner, DO   PT End of Session - 05/02/21 1547     Visit Number 3    Number of Visits 11    Date for PT Re-Evaluation 06/26/21    Authorization Type [employer]    PT Start Time 1600    PT Stop Time 1640    PT Time Calculation (min) 40 min    Activity Tolerance Patient tolerated treatment well    Behavior During Therapy [facility] for tasks assessed/performed             Past Medical History:  Diagnosis Date   Abnormal Pap smear of cervix    Hyperlipemia    Hypertension    Past Surgical History:  Procedure Laterality Date   L ankle external fixation Left 2016   Pt report through interpreter   There are no problems to display for this patient.   REFERRING PROVIDER: Vanetta Mulders, MD   REFERRING DIAG: [phone] (ICD-10-CM) - Pain of joint of left ankle and foot   THERAPY DIAG:  No diagnosis found.   ONSET DATE: 2016   SUBJECTIVE: Pt requires interpreter.    SUBJECTIVE STATEMENT: Pt states that the feels "stressed today." She states that the new exercises from last time causes more pain into the ankle, hip, and the hamstring. Pt states that there is a pain that stays there at night. Pt states she is able to walk and do her daily routine but does cause pain.    PERTINENT HISTORY: L ankle fracture   PAIN:  Are you having pain? Yes NPRS scale: 5/10 Pain location: L lateral ankle and post ankle/achilles PAIN TYPE: burning Pain description: intermittent  Aggravating factors: walking Relieving factors: ice/heat   PRECAUTIONS: None   WEIGHT BEARING RESTRICTIONS No      OCCUPATION: not currently    PLOF: Independent        OBJECTIVE:    DIAGNOSTIC FINDINGS:   FINDINGS: There is diffuse soft tissue swelling surrounding the ankle. There is no acute fracture or dislocation identified. There is severe talotibial joint space narrowing with bone-on-bone configuration and sclerosis. Small osteophyte formation present.   IMPRESSION: 1. 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 2. No acute fracture. 3. Soft tissue swelling.   LE AROM/PROM:   A/PROM Right 03/27/2021 Left 03/27/2021 L 2/8  Ankle dorsiflexion WFL -5 7  Ankle plantarflexion WFL 50 50  Ankle inversion WFL 20 18  Ankle eversion WFL 15 23   (Blank rows = not tested)   LE MMT:   MMT Right 03/27/2021 Left 03/27/2021 L 2/8  Ankle DF 5/5 4+/5 p! 5/5 no p!  Ankle plantarflexion 5/5 4+/5 p! 4+/5  Ankle inversion 55 4+/5p! 5/5 no p!  Ankle eversion 4+/5 4+/5p! 4+/5   (Blank rows = not tested)   FUNCTIONAL TESTS:  STS no longer requires UE- unable to test today SLS: 7s on L ankle   GAIT: Distance walked: 81ft Assistive device utilized: None Level of assistance: Complete Independence Comments:  WFL after trial of 2 layer heel lift in bilateral shoes. Pt able to perform gait without pain following  Pt wearing ankle brace on L, no cane today.  TODAY'S TREATMENT:  Review of previous HEP and technique modifications  Edu about footwear selection and options for reduction of Achilles and medial ankle pain Heel lift trial in bilateral shoes- 2 layers in each Reported reduced pain and improved ankle stability following introduction of lift Gait modifications with heel lift and with daily activity     PATIENT EDUCATION:  Education details: foot mechanics, anatomy, HEP modification, footwear/orthotic fit, footwear choices, heel cup usage, exercise progression, DOMS expectations, muscle firing,  envelope of function, HEP   Person educated: Patient Education method: Explanation, Demonstration, Tactile cues, Verbal cues, and Handouts Education comprehension: verbalized  understanding, returned demonstration, verbal cues required, and tactile cues required     HOME EXERCISE PROGRAM: Access Code: [email or web address] URL: [email or web address] Date: 03/28/2021 Prepared by: Daleen Bo  Exercises Standing Heel Raise with Support - 2 x daily - 7 x weekly - 2 sets - 10 reps Gastroc Stretch on Wall - 2 x daily - 7 x weekly - 1 sets - 3 reps - 30 hold Tandem Stance - 2 x daily - 7 x weekly - 1 sets - 4 reps - 30 hold   ASSESSMENT:   CLINICAL IMPRESSION: Pt demonstrates significant improvement in L ankle strength and ROM since initial visit. Pt is able to tolerate more SL balance activity and WB but does continue to have Achille's tendon and medial ankle pain when in CKC positions. Pt given trial of heel lift today with report of signficantly stability and reported "strength" through interpreter. Pt given thorough edu about heel lift usage as well as potential heel cup as pt consistently decreased heel pain with hard surfaces. Pt imaging was thoroughly explained and functional expectations reviewed. Plan to assess for efficacy of heel lift at next session. HEP not changed at this time in order to eliminate variables.  Pt should benefit from skilled PT to address above impairments and improve overall function.    Objective impairments include Abnormal gait, decreased activity tolerance, decreased balance, decreased mobility, difficulty walking, decreased ROM, decreased strength, hypomobility, increased fascial restrictions, increased muscle spasms, impaired flexibility, improper body mechanics, and pain.  These impairments are limiting patient from cleaning, community activity, driving, meal prep, occupation, laundry, yard work, shopping, and recreation/exercise . Personal factors including Age, Behavior pattern, Fitness, and Time since onset of injury/illness/exacerbation are also affecting patient's functional outcome.   REHAB POTENTIAL: Fair     CLINICAL DECISION  MAKING: Stable/uncomplicated   EVALUATION COMPLEXITY: Low     GOALS:     SHORT TERM GOALS:   STG Name Target Date Goal status  1 Pt will become independent with HEP in order to demonstrate synthesis of PT education.   Baseline:  05/08/2021 MET  2 Pt will score at least 11 pt increase on FOTO to demonstrate functional improvement in MCII and pt perceived function.    Baseline:  05/08/2021 Unable to assess  3 Pt will be able to demonstrate neutral DF of L ankle in CKC in order to demonstrate functional improvement in LE function for self-care and house hold duties.   Baseline: 05/08/2021 Achieved    LONG TERM GOALS:    LTG Name Target Date Goal status  1 Pt  will become independent with final HEP in order to demonstrate synthesis of PT education.   Baseline: 06/19/2021 ongoing  2 Pt will score >/= 65 on FOTO to demonstrate improvement in perceived ankle function.   Baseline: 06/19/2021 ongoing  3 Pt will be able to demonstrate/report ability to walk >15 mins without pain in order to demonstrate functional improvement and tolerance  to exercise and community mobility.    Baseline: 06/19/2021 Partially met    PLAN: PT FREQUENCY: 1-2x/week   PT DURATION: 12 weeks (likely D/C by 8)   PLANNED INTERVENTIONS: Therapeutic exercises, Therapeutic activity, Neuro Muscular re-education, Balance training, Gait training, Patient/Family education, Joint mobilization, Stair training, Prosthetic training, Aquatic Therapy, Dry Needling, Electrical stimulation, Spinal mobilization, Cryotherapy, Moist heat, scar mobilization, Taping, Vasopneumatic device, Traction, Ultrasound, Ionotophoresis 4mg /ml Dexamethasone, and Manual therapy   PLAN FOR NEXT SESSION: review HEP, wood pecker, ankle IV EV banded  Daleen Bo PT, DPT 05/02/21 5:49 PM

## 2021-05-09 ENCOUNTER — Ambulatory Visit (HOSPITAL_BASED_OUTPATIENT_CLINIC_OR_DEPARTMENT_OTHER): Payer: Medicare (Managed Care) | Admitting: Physical Therapy

## 2021-05-09 ENCOUNTER — Encounter (HOSPITAL_BASED_OUTPATIENT_CLINIC_OR_DEPARTMENT_OTHER): Payer: Self-pay | Admitting: Physical Therapy

## 2021-05-09 ENCOUNTER — Other Ambulatory Visit: Payer: Self-pay

## 2021-05-09 DIAGNOSIS — M25672 Stiffness of left ankle, not elsewhere classified: Secondary | ICD-10-CM | POA: Diagnosis not present

## 2021-05-09 DIAGNOSIS — M25572 Pain in left ankle and joints of left foot: Secondary | ICD-10-CM

## 2021-05-09 DIAGNOSIS — R262 Difficulty in walking, not elsewhere classified: Secondary | ICD-10-CM

## 2021-05-09 DIAGNOSIS — M6281 Muscle weakness (generalized): Secondary | ICD-10-CM

## 2021-05-09 NOTE — Therapy (Signed)
OUTPATIENT PHYSICAL THERAPY TREATMENT NOTE  Patient Name: Dawn Cuevas MRN: [id number] DOB:1955/05/10, 66 y.o., female Today's Date: 05/09/2021  PCP: Gara Kroner, DO   PT End of Session - 05/09/21 0623     Visit Number 4    Number of Visits 11    Date for PT Re-Evaluation 06/26/21    Authorization Type [employer]    PT Start Time 1600    PT Stop Time 1640    PT Time Calculation (min) 40 min    Activity Tolerance Patient tolerated treatment well    Behavior During Therapy [facility] for tasks assessed/performed              Past Medical History:  Diagnosis Date   Abnormal Pap smear of cervix    Hyperlipemia    Hypertension    Past Surgical History:  Procedure Laterality Date   L ankle external fixation Left 2016   Pt report through interpreter   There are no problems to display for this patient.   REFERRING PROVIDER: Vanetta Mulders, MD   REFERRING DIAG: [id number] (ICD-10-CM) - Pain of joint of left ankle and foot   THERAPY DIAG:  No diagnosis found.   ONSET DATE: 2016   SUBJECTIVE: Pt requires interpreter.    SUBJECTIVE STATEMENT: PT states that she still has pain with her daily walking. When she moves the ankle up and down, she still has pain. She had less pain at night. She states she is walking up to 2 hours in the grocery store. It is less than before. Pt states she has been using the cane when she is going to walk more.    PERTINENT HISTORY: L ankle fracture   PAIN:  Are you having pain? no NPRS scale: 0/10 Pain location: L lateral ankle and post ankle/achilles PAIN TYPE: burning Pain description: intermittent  Aggravating factors: walking Relieving factors: ice/heat   PRECAUTIONS: None   WEIGHT BEARING RESTRICTIONS No      OCCUPATION: not currently    PLOF: Independent        OBJECTIVE:    DIAGNOSTIC FINDINGS:  FINDINGS: There is diffuse soft tissue swelling surrounding the ankle. There is no acute fracture or  dislocation identified. There is severe talotibial joint space narrowing with bone-on-bone configuration and sclerosis. Small osteophyte formation present.   IMPRESSION: 1. 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 2. No acute fracture. 3. Soft tissue swelling.   TODAY'S TREATMENT:  Review of previous HEP and technique modifications    Edu about footwear modifications and options for reduction of Achilles and medial ankle pain Heel lift trial in bilateral shoes- 2 layers plus heel cup in each -Reported reduced pain and improved ankle stability following introduction of lift SPC sizing and sequencing with gait Stairs and sequencing with cane for safety/relief of pressure on R knee  DL HR 2x10- reduced pain following heel left     PATIENT EDUCATION:  Education details: foot mechanics, anatomy, HEP modification, footwear/orthotic fit, footwear choices, heel cup usage, exercise progression, envelope of function, HEP   Person educated: Patient Education method: Explanation, Demonstration, Tactile cues, Verbal cues, and Handouts Education comprehension: verbalized understanding, returned demonstration, verbal cues required, and tactile cues required     HOME EXERCISE PROGRAM: Access Code: [email or web address] URL: [email or web address] Date: 03/28/2021 Prepared by: Daleen Bo  Exercises Standing Heel Raise with Support - 2 x daily - 7 x weekly - 2 sets - 10 reps Gastroc Stretch on Wall - 2 x daily - 7 x  weekly - 1 sets - 3 reps - 30 hold Tandem Stance - 2 x daily - 7 x weekly - 1 sets - 4 reps - 30 hold   ASSESSMENT:   CLINICAL IMPRESSION: Pt presents with single heel cup in bilateral shoes. Once PT added in previous 2 layers of lift plus cup, pt had reduced pain and was able proceed with gait and stair training. Pt reports feeling more "level" with walking and had reducing pain into the L anterior and lateral ankle. Pt did require increased VC and TC with AD  usage/sizing in order to relieve pain. Pt to continue with strengthening exercise during HEP. Interpreter translations with mixed messages at today's session- answers and questions were contradictory during portions of treatment/assessment. Plan to assess for efficacy of heel lift and AD usage on stairs at next session. Pt should benefit from skilled PT to address above impairments and improve overall function.    Objective impairments include Abnormal gait, decreased activity tolerance, decreased balance, decreased mobility, difficulty walking, decreased ROM, decreased strength, hypomobility, increased fascial restrictions, increased muscle spasms, impaired flexibility, improper body mechanics, and pain.  These impairments are limiting patient from cleaning, community activity, driving, meal prep, occupation, laundry, yard work, shopping, and recreation/exercise . Personal factors including Age, Behavior pattern, Fitness, and Time since onset of injury/illness/exacerbation are also affecting patient's functional outcome.   REHAB POTENTIAL: Fair     CLINICAL DECISION MAKING: Stable/uncomplicated   EVALUATION COMPLEXITY: Low     GOALS:     SHORT TERM GOALS:   STG Name Target Date Goal status  1 Pt will become independent with HEP in order to demonstrate synthesis of PT education.   Baseline:  05/08/2021 MET  2 Pt will score at least 11 pt increase on FOTO to demonstrate functional improvement in MCII and pt perceived function.    Baseline:  05/08/2021 Unable to assess  3 Pt will be able to demonstrate neutral DF of L ankle in CKC in order to demonstrate functional improvement in LE function for self-care and house hold duties.   Baseline: 05/08/2021 Achieved    LONG TERM GOALS:    LTG Name Target Date Goal status  1 Pt  will become independent with final HEP in order to demonstrate synthesis of PT education.   Baseline: 06/19/2021 ongoing  2 Pt will score >/= 65 on FOTO to demonstrate  improvement in perceived ankle function.   Baseline: 06/19/2021 ongoing  3 Pt will be able to demonstrate/report ability to walk >15 mins without pain in order to demonstrate functional improvement and tolerance to exercise and community mobility.    Baseline: 06/19/2021 Partially met    PLAN: PT FREQUENCY: 1-2x/week   PT DURATION: 12 weeks (likely D/C by 8)   PLANNED INTERVENTIONS: Therapeutic exercises, Therapeutic activity, Neuro Muscular re-education, Balance training, Gait training, Patient/Family education, Joint mobilization, Stair training, Prosthetic training, Aquatic Therapy, Dry Needling, Electrical stimulation, Spinal mobilization, Cryotherapy, Moist heat, scar mobilization, Taping, Vasopneumatic device, Traction, Ultrasound, Ionotophoresis 85m/ml Dexamethasone, and Manual therapy   PLAN FOR NEXT SESSION: review HEP and lift/walking, if able- wood pecker, ankle IV EV banded  ADaleen BoPT, DPT 05/09/21 4:57 PM

## 2021-05-16 ENCOUNTER — Encounter (HOSPITAL_BASED_OUTPATIENT_CLINIC_OR_DEPARTMENT_OTHER): Payer: Self-pay | Admitting: Physical Therapy

## 2021-05-16 ENCOUNTER — Ambulatory Visit (HOSPITAL_BASED_OUTPATIENT_CLINIC_OR_DEPARTMENT_OTHER): Payer: Medicare (Managed Care) | Admitting: Physical Therapy

## 2021-05-16 ENCOUNTER — Other Ambulatory Visit: Payer: Self-pay

## 2021-05-16 DIAGNOSIS — M25672 Stiffness of left ankle, not elsewhere classified: Secondary | ICD-10-CM | POA: Diagnosis not present

## 2021-05-16 DIAGNOSIS — M25572 Pain in left ankle and joints of left foot: Secondary | ICD-10-CM

## 2021-05-16 DIAGNOSIS — M6281 Muscle weakness (generalized): Secondary | ICD-10-CM

## 2021-05-16 DIAGNOSIS — R262 Difficulty in walking, not elsewhere classified: Secondary | ICD-10-CM

## 2021-05-16 NOTE — Therapy (Signed)
OUTPATIENT PHYSICAL THERAPY TREATMENT NOTE  Patient Name: Dawn Cuevas MRN: [id number] DOB:December 23, 1955, 66 y.o., female Today's Date: 05/16/2021  PCP: Gara Kroner, DO   PT End of Session - 05/16/21 1548     Visit Number 5    Number of Visits 11    Date for PT Re-Evaluation 06/26/21    Authorization Type [employer]    PT Start Time 1600    PT Stop Time 1630    PT Time Calculation (min) 30 min    Activity Tolerance Patient tolerated treatment well    Behavior During Therapy [facility] for tasks assessed/performed               Past Medical History:  Diagnosis Date   Abnormal Pap smear of cervix    Hyperlipemia    Hypertension    Past Surgical History:  Procedure Laterality Date   L ankle external fixation Left 2016   Pt report through interpreter   There are no problems to display for this patient.   REFERRING PROVIDER: Vanetta Mulders, MD   REFERRING DIAG: [phone] (ICD-10-CM) - Pain of joint of left ankle and foot   THERAPY DIAG:  No diagnosis found.   ONSET DATE: 2016   SUBJECTIVE: Pt requires interpreter.    SUBJECTIVE STATEMENT: Pt states that the lift made the ankle more painful.  She states that walking around is still painful. Pt states she noticed the  difference after an hour of walking. She states that standing for 15 mins will still cause pain.   However, she states that she does feel improvement with therapy.   PERTINENT HISTORY: L ankle fracture   PAIN:  Are you having pain? no NPRS scale: 0/10 Pain location: L lateral ankle and post ankle/achilles PAIN TYPE: burning Pain description: intermittent  Aggravating factors: walking Relieving factors: ice/heat   PRECAUTIONS: None   WEIGHT BEARING RESTRICTIONS No    OCCUPATION: not currently    PLOF: Independent     OBJECTIVE:  LE AROM/PROM:   A/PROM Right 03/27/2021 Left 03/27/2021 2/22  Ankle dorsiflexion WFL -5 0  Ankle plantarflexion WFL 50 45  Ankle inversion WFL 20 23   Ankle eversion WFL 15 15   (Blank rows = not tested)   LE MMT:   MMT Right 03/27/2021 Left 03/27/2021 2/22  Ankle DF 5/5 4+/5 p! 4+/5 p!  Ankle plantarflexion 5/5 4+/5 p! 4/5 p!  Ankle inversion 55 4+/5 p! 4+/5p!  Ankle eversion 4+/5 4+/5 p! 4+/5   (Blank rows = not tested)      PATIENT SURVEYS:  FOTO 54 D/C 65 11 pts MCII 2/22 FOTO 51pts    DIAGNOSTIC FINDINGS:  FINDINGS: There is diffuse soft tissue swelling surrounding the ankle. There is no acute fracture or dislocation identified. There is severe talotibial joint space narrowing with bone-on-bone configuration and sclerosis. Small osteophyte formation present.   IMPRESSION: 1. 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 2. No acute fracture. 3. Soft tissue swelling.   TODAY'S TREATMENT:  Exercises Standing Heel Raise with Support - 2 x daily - 7 x weekly - 2 sets - 10 reps Gastroc Stretch on Wall - 2 x daily - 7 x weekly - 1 sets - 3 reps - 30 hold Tandem Stance - 2 x daily - 7 x weekly - 1 sets - 4 reps - 30 hold Sit to Stand with Arms Crossed - 2 x daily - 7 x weekly - 2 sets - 10 reps  PATIENT EDUCATION:  Education details: HEP modification, footwear/orthotic fit, footwear choices, heel cup usage, return to MD   Person educated: Patient Education method: Explanation, Demonstration, Tactile cues, Verbal cues, and Handouts Education comprehension: verbalized understanding, returned demonstration, verbal cues required, and tactile cues required     HOME EXERCISE PROGRAM: Access Code: [email or web address] URL: [email or web address] Date: 03/28/2021 Prepared by: Daleen Bo     ASSESSMENT:   CLINICAL IMPRESSION: Pt demonstrates minimal/no change with therapy as evidenced by ROM, gait, FOTO, and pt report of  continued pain with walking and sustained WB. Pt continues to have similar levels of pain with activity, especially walking and during ADL. Pt advised to return to MD for consideration  of further management options with potential for referral for custom orthotics. Plan to hold on therapy until pt sees MD. Pt verbalizes understanding and agrees to change in POC.    Objective impairments include Abnormal gait, decreased activity tolerance, decreased balance, decreased mobility, difficulty walking, decreased ROM, decreased strength, hypomobility, increased fascial restrictions, increased muscle spasms, impaired flexibility, improper body mechanics, and pain.  These impairments are limiting patient from cleaning, community activity, driving, meal prep, occupation, laundry, yard work, shopping, and recreation/exercise . Personal factors including Age, Behavior pattern, Fitness, and Time since onset of injury/illness/exacerbation are also affecting patient's functional outcome.   REHAB POTENTIAL: Fair     CLINICAL DECISION MAKING: Stable/uncomplicated   EVALUATION COMPLEXITY: Low     GOALS:     SHORT TERM GOALS:   STG Name Target Date Goal status  1 Pt will become independent with HEP in order to demonstrate synthesis of PT education.   Baseline:  05/08/2021 MET  2 Pt will score at least 11 pt increase on FOTO to demonstrate functional improvement in MCII and pt perceived function.    Baseline:  05/08/2021 Unable to assess  3 Pt will be able to demonstrate neutral DF of L ankle in CKC in order to demonstrate functional improvement in LE function for self-care and house hold duties.   Baseline: 05/08/2021 Achieved    LONG TERM GOALS:    LTG Name Target Date Goal status  1 Pt  will become independent with final HEP in order to demonstrate synthesis of PT education.   Baseline: 06/19/2021 ongoing  2 Pt will score >/= 65 on FOTO to demonstrate improvement in perceived ankle function.   Baseline: 06/19/2021 ongoing  3 Pt will be able to demonstrate/report ability to walk >15 mins without pain in order to demonstrate functional improvement and tolerance to exercise and  community mobility.    Baseline: 06/19/2021 Partially met    PLAN: PT FREQUENCY: 1-2x/week   PT DURATION: 12 weeks (likely D/C by 8)   PLANNED INTERVENTIONS: Therapeutic exercises, Therapeutic activity, Neuro Muscular re-education, Balance training, Gait training, Patient/Family education, Joint mobilization, Stair training, Prosthetic training, Aquatic Therapy, Dry Needling, Electrical stimulation, Spinal mobilization, Cryotherapy, Moist heat, scar mobilization, Taping, Vasopneumatic device, Traction, Ultrasound, Ionotophoresis 4mg /ml Dexamethasone, and Manual therapy     Daleen Bo PT, DPT 05/16/21 4:39 PM

## 2021-05-23 ENCOUNTER — Encounter (HOSPITAL_BASED_OUTPATIENT_CLINIC_OR_DEPARTMENT_OTHER): Payer: Medicare (Managed Care) | Admitting: Physical Therapy

## 2021-06-13 ENCOUNTER — Ambulatory Visit (HOSPITAL_BASED_OUTPATIENT_CLINIC_OR_DEPARTMENT_OTHER): Payer: Medicare (Managed Care) | Admitting: Orthopaedic Surgery

## 2021-06-29 DIAGNOSIS — M25572 Pain in left ankle and joints of left foot: Secondary | ICD-10-CM | POA: Diagnosis not present

## 2021-06-29 DIAGNOSIS — G8929 Other chronic pain: Secondary | ICD-10-CM | POA: Diagnosis not present

## 2021-06-29 DIAGNOSIS — K5909 Other constipation: Secondary | ICD-10-CM | POA: Diagnosis not present

## 2021-06-29 DIAGNOSIS — R1084 Generalized abdominal pain: Secondary | ICD-10-CM | POA: Diagnosis not present

## 2021-07-11 DIAGNOSIS — M25572 Pain in left ankle and joints of left foot: Secondary | ICD-10-CM | POA: Diagnosis not present

## 2021-07-11 DIAGNOSIS — G8929 Other chronic pain: Secondary | ICD-10-CM | POA: Diagnosis not present

## 2021-07-16 DIAGNOSIS — N281 Cyst of kidney, acquired: Secondary | ICD-10-CM | POA: Diagnosis not present

## 2022-01-18 ENCOUNTER — Ambulatory Visit: Payer: Medicare (Managed Care) | Admitting: Family Medicine

## 2022-01-28 DIAGNOSIS — M25572 Pain in left ankle and joints of left foot: Secondary | ICD-10-CM | POA: Diagnosis not present

## 2022-01-28 DIAGNOSIS — M19072 Primary osteoarthritis, left ankle and foot: Secondary | ICD-10-CM | POA: Diagnosis not present

## 2022-01-28 DIAGNOSIS — G8929 Other chronic pain: Secondary | ICD-10-CM | POA: Diagnosis not present

## 2022-03-01 ENCOUNTER — Encounter: Payer: Self-pay | Admitting: Family Medicine

## 2022-03-01 ENCOUNTER — Ambulatory Visit (INDEPENDENT_AMBULATORY_CARE_PROVIDER_SITE_OTHER): Payer: Medicare (Managed Care) | Admitting: Family Medicine

## 2022-03-01 VITALS — BP 136/82 | HR 75 | Temp 97.9°F | Ht 62.5 in | Wt 196.6 lb

## 2022-03-01 DIAGNOSIS — I10 Essential (primary) hypertension: Secondary | ICD-10-CM

## 2022-03-01 DIAGNOSIS — K5900 Constipation, unspecified: Secondary | ICD-10-CM

## 2022-03-01 DIAGNOSIS — M25561 Pain in right knee: Secondary | ICD-10-CM | POA: Diagnosis not present

## 2022-03-01 DIAGNOSIS — E785 Hyperlipidemia, unspecified: Secondary | ICD-10-CM

## 2022-03-01 DIAGNOSIS — M19072 Primary osteoarthritis, left ankle and foot: Secondary | ICD-10-CM

## 2022-03-01 DIAGNOSIS — K219 Gastro-esophageal reflux disease without esophagitis: Secondary | ICD-10-CM

## 2022-03-01 MED ORDER — POLYETHYLENE GLYCOL 3350 17 GM/SCOOP PO POWD
17.0000 g | Freq: Two times a day (BID) | ORAL | 1 refills | Status: DC | PRN
Start: 2022-03-01 — End: 2022-09-30

## 2022-03-01 MED ORDER — SIMVASTATIN 10 MG PO TABS: 10.0000 mg | ORAL_TABLET | Freq: Every evening | ORAL | 0 refills | Status: DC

## 2022-03-01 MED ORDER — PANTOPRAZOLE SODIUM 40 MG PO TBEC: 40.0000 mg | DELAYED_RELEASE_TABLET | Freq: Every day | ORAL | 0 refills | Status: DC

## 2022-03-01 MED ORDER — AMLODIPINE BESYLATE-VALSARTAN 10-320 MG PO TABS: 1.0000 | ORAL_TABLET | Freq: Every day | ORAL | 0 refills | Status: DC

## 2022-03-01 NOTE — Patient Instructions (Addendum)
For constipation, try polyethylene glycol powder as prescribed. We have refilled your blood pressure, heart burn, and cholesterol medications. We referred you to physical therapy.

## 2022-03-01 NOTE — Progress Notes (Unsigned)
Assessment/Plan:   Problem List Items Addressed This Visit   None Visit Diagnoses     Constipation, unspecified constipation type    -  Primary   Relevant Medications   polyethylene glycol powder (GLYCOLAX/MIRALAX) 17 GM/SCOOP powder   Arthritis of left ankle       Relevant Orders   Ambulatory referral to Physical Therapy   Arthralgia of right knee       Relevant Orders   Ambulatory referral to Physical Therapy   Primary hypertension       Relevant Medications   amLODipine-valsartan (EXFORGE) 10-320 MG tablet   simvastatin (ZOCOR) 10 MG tablet   Gastroesophageal reflux disease, unspecified whether esophagitis present       Relevant Medications   polyethylene glycol powder (GLYCOLAX/MIRALAX) 17 GM/SCOOP powder   pantoprazole (PROTONIX) 40 MG tablet   Hyperlipidemia, unspecified hyperlipidemia type       Relevant Medications   amLODipine-valsartan (EXFORGE) 10-320 MG tablet   simvastatin (ZOCOR) 10 MG tablet          Subjective:  HPI:  Dawn Cuevas is a 66 y.o. female who does not have a problem list on file..   She  has a past medical history of Abnormal Pap smear of cervix, Hyperlipemia, and Hypertension..   She presents with chief complaint of Establish Care (Spanish interpreter  Claudia /=) .  Most important wants to walk better  Lower extremity pain including left ankle pain and bilateral knee pain.  Patient sustained left ankle fracture 6 years ago.  Since then she has had ongoing left ankle pain.  She also has had ongoing right knee pain due to change in her gait.  She has followed with orthopedics in the past.  She is interested in pursuing physical therapy with a location card provided requesting referral.  Per chart review patient was seen by [facility] on 01/28/2022 and was started on meloxicam, however patient denies this and states that she does not remember going to this visit  Dry mouth past two years  GERD.  Patient has history of GERD  controlled on Protonix.  He has followed up with GI in the past.  She had an endoscopy that patient reports was negative for cancer.  Constipation: Patient complains of intermittent constipation.  She reports hard stools with a bowel movement every other day.  She not currently using any medications.  She has tried she denies abdominal pain, melena, hematochezia, stool caliber.   HTN.  Symptoms well-controlled on amlodipine/valsartan.  Needs refill  HLD.  Patient takes simvastatin 10 mg without myalgia.     Past Surgical History:  Procedure Laterality Date   L ankle external fixation Left 2016   Pt report through interpreter    Outpatient Medications Prior to Visit  Medication Sig Dispense Refill   amLODipine-valsartan (EXFORGE) 10-320 MG tablet Take 1 tablet by mouth daily.     pantoprazole (PROTONIX) 40 MG tablet Take by mouth.     simvastatin (ZOCOR) 10 MG tablet Take 1 tablet by mouth every evening.     amLODipine (NORVASC) 10 MG tablet Take 10 mg by mouth daily. (Patient not taking: Reported on 03/01/2022)     carvedilol (COREG) 25 MG tablet Take 25 mg by mouth 2 (two) times daily. (Patient not taking: Reported on 03/01/2022)     metroNIDAZOLE (FLAGYL) 500 MG tablet Take 1 tablet (500 mg total) by mouth 2 (two) times daily. (Patient not taking: Reported on 03/01/2022) 14 tablet 0   No  facility-administered medications prior to visit.    Family History  Problem Relation Age of Onset   Heart disease Mother    Diabetes Father    Hypertension Father     Social History   Socioeconomic History   Marital status: Married    Spouse name: Not on file   Number of children: Not on file   Years of education: Not on file   Highest education level: Not on file  Occupational History   Not on file  Tobacco Use   Smoking status: Never    Passive exposure: Never   Smokeless tobacco: Never  Vaping Use   Vaping Use: Never used  Substance and Sexual Activity   Alcohol use: No    Drug use: No   Sexual activity: Yes    Birth control/protection: None  Other Topics Concern   Not on file  Social History Narrative   Not on file   Social Determinants of Health   Financial Resource Strain: Not on file  Food Insecurity: Not on file  Transportation Needs: Not on file  Physical Activity: Not on file  Stress: Not on file  Social Connections: Not on file  Intimate Partner Violence: Not on file                                                                                                 Objective:  Physical Exam: BP 136/82 (BP Location: Left Arm, Patient Position: Sitting, Cuff Size: Large)   Pulse 75   Temp 97.9 F (36.6 C) (Temporal)   Ht 5' 2.5" (1.588 m)   Wt 196 lb 9.6 oz (89.2 kg)   SpO2 97%   BMI 35.39 kg/m    Gen: NAD, resting comfortably CV: RRR with no murmurs appreciated Pulm: NWOB, CTAB with no crackles, wheezes, or rhonchi GI: Normal bowel sounds present. Soft, Nontender, Nondistended. MSK: no edema, cyanosis, or clubbing noted Skin: warm, dry Neuro: grossly normal, moves all extremities Psych: Normal affect and thought content        Alesia Banda, MD, MS

## 2022-03-11 ENCOUNTER — Telehealth: Payer: Self-pay | Admitting: Family Medicine

## 2022-03-11 NOTE — Telephone Encounter (Signed)
Dawn Cuevas from [facility] called and stated that we have to find a new refferal for the patient that speaks spanish. Pt only speaks spanish [phone] )

## 2022-04-10 ENCOUNTER — Ambulatory Visit (INDEPENDENT_AMBULATORY_CARE_PROVIDER_SITE_OTHER): Payer: Medicare (Managed Care) | Admitting: Family Medicine

## 2022-04-10 ENCOUNTER — Encounter: Payer: Self-pay | Admitting: Family Medicine

## 2022-04-10 VITALS — BP 184/106 | HR 76 | Temp 97.2°F | Ht 62.5 in | Wt 196.8 lb

## 2022-04-10 DIAGNOSIS — J069 Acute upper respiratory infection, unspecified: Secondary | ICD-10-CM | POA: Diagnosis not present

## 2022-04-10 DIAGNOSIS — I10 Essential (primary) hypertension: Secondary | ICD-10-CM

## 2022-04-10 LAB — POCT INFLUENZA A/B
Influenza A, POC: NEGATIVE
Influenza B, POC: NEGATIVE

## 2022-04-10 LAB — POC COVID19 BINAXNOW: SARS Coronavirus 2 Ag: NEGATIVE

## 2022-04-10 MED ORDER — BENZONATATE 200 MG PO CAPS: 200.0000 mg | ORAL_CAPSULE | Freq: Two times a day (BID) | ORAL | 0 refills | Status: DC | PRN

## 2022-04-10 NOTE — Progress Notes (Signed)
[facility] [address] [place] [address] Dept: [phone] Dept Fax: [phone]  Office Visit  Subjective:    Patient ID: Dawn Cuevas, female    DOB: 1955/06/10, 67 y.o..   MRN: [id number]  Chief Complaint  Patient presents with   Acute Visit    C/o having cough with phlegm, pain in chest due to cough x 5 days. No OTC meds taken.    Medical Interpretation provided by Cindee Salt [phone]  History of Present Illness:  Patient is in today complaining of a 5-day history of cough with mucous production, sore throat, nasal congestion. She has not been running fever. She does have some chest discomfort. She is using hot tea (either ginger or green tea) with honey. She does not use tobacco. She does note that when she was an infant (6 months) she had severe bronchitis. Her mother was told that she would die form this. Her grandmother prayed for her and she got better. She feels she has chronic bronchitis since that time.  [name] has a history of hypertension. She is managed on Exforge 10-320 mg daily. She states she has been taking her medicine.  Past Medical History: Patient Active Problem List   Diagnosis Date Noted   GERD without esophagitis 01/08/2021   Primary osteoarthritis of right knee 11/04/2018   Osteochondroma of right tibia 11/04/2018   Prediabetes 05/11/2018   Uses Spanish as primary spoken language 04/10/2018   Hyperlipidemia 04/10/2018   Essential hypertension 04/10/2018   Traumatic arthritis of left ankle 09/28/2015   Past Surgical History:  Procedure Laterality Date   L ankle external fixation Left 2016   Pt report through interpreter   Family History  Problem Relation Age of Onset   Heart disease Mother    Diabetes Father    Hypertension Father    Outpatient Medications Prior to Visit  Medication Sig Dispense Refill   amLODipine-valsartan (EXFORGE) 10-320 MG tablet Take 1 tablet by mouth  daily. 90 tablet 0   pantoprazole (PROTONIX) 40 MG tablet Take 1 tablet (40 mg total) by mouth daily. 90 tablet 0   polyethylene glycol powder (GLYCOLAX/MIRALAX) 17 GM/SCOOP powder Take 17 g by mouth 2 (two) times daily as needed. 3350 g 1   simvastatin (ZOCOR) 10 MG tablet Take 1 tablet (10 mg total) by mouth every evening. 90 tablet 0   No facility-administered medications prior to visit.   Allergies  Allergen Reactions   Aspirin Shortness Of Breath    Other Reaction(s): Other (See Comments)  Pt states that she feels "desperate"     Objective:   Today's Vitals   04/10/22 1507  BP: (!) 188/106  Pulse: 76  Temp: (!) 97.2 F (36.2 C)  TempSrc: Temporal  SpO2: 98%  Weight: 196 lb 12.8 oz (89.3 kg)  Height: 5' 2.5" (1.588 m)   Body mass index is 35.42 kg/m.   General: Well developed, well nourished. No acute distress. HEENT: Normocephalic, non-traumatic. Conjunctiva clear. External ears normal. EAC and TMs normal   bilaterally. Nose with moderate congestion and rhinorrhea. Mucous membranes moist. Oropharynx with   mild mucous streaking. Good dentition. Neck: Supple. No lymphadenopathy. No thyromegaly. Lungs: Clear to auscultation bilaterally. No wheezing, rales or rhonchi. Psych: Alert and oriented. Normal mood and affect.  Health Maintenance Due  Topic Date Due   Medicare Annual Wellness (AWV)  Never done   Hepatitis C Screening  Never done   Zoster Vaccines- Shingrix (1 of 2) Never  done   Pneumonia Vaccine 75+ Years old (1 - PCV) Never done   DEXA SCAN  Never done   Lab Results POCT Covid: Neg. POCT Influenza A& B: Neg.    Assessment & Plan:   1. Viral URI with cough Discussed home care for viral illness, including rest, pushing fluids, and OTC medications as needed for symptom relief. Recommend hot tea with honey for sore throat symptoms. Follow-up if needed for worsening or persistent symptoms.  - POCT Influenza A/B - POC COVID-19 - benzonatate (TESSALON) 200  MG capsule; Take 1 capsule (200 mg total) by mouth 2 (two) times daily as needed for cough.  Dispense: 20 capsule; Refill: 0  2. Essential hypertension BP may be elevated due to acute illness. I recommend she return in 2 weeks to recheck on her BP. She should continue her Exforge 10-325 mg daily for now.  Return in about 2 weeks (around 04/24/2022) for Reassessment.   Haydee Salter, MD

## 2022-04-12 ENCOUNTER — Encounter: Payer: Self-pay | Admitting: Family Medicine

## 2022-06-18 ENCOUNTER — Ambulatory Visit (INDEPENDENT_AMBULATORY_CARE_PROVIDER_SITE_OTHER): Payer: Medicare (Managed Care) | Admitting: Family Medicine

## 2022-06-18 ENCOUNTER — Encounter: Payer: Self-pay | Admitting: Family Medicine

## 2022-06-18 VITALS — BP 144/90 | HR 73 | Temp 98.0°F | Ht 62.5 in | Wt 198.4 lb

## 2022-06-18 DIAGNOSIS — N958 Other specified menopausal and perimenopausal disorders: Secondary | ICD-10-CM | POA: Diagnosis not present

## 2022-06-18 DIAGNOSIS — R053 Chronic cough: Secondary | ICD-10-CM

## 2022-06-18 DIAGNOSIS — K5900 Constipation, unspecified: Secondary | ICD-10-CM | POA: Diagnosis not present

## 2022-06-18 DIAGNOSIS — K219 Gastro-esophageal reflux disease without esophagitis: Secondary | ICD-10-CM

## 2022-06-18 DIAGNOSIS — I10 Essential (primary) hypertension: Secondary | ICD-10-CM

## 2022-06-18 LAB — POCT URINALYSIS DIPSTICK
Bilirubin, UA: NEGATIVE
Blood, UA: NEGATIVE
Glucose, UA: NEGATIVE
Ketones, UA: NEGATIVE
Leukocytes, UA: NEGATIVE
Nitrite, UA: NEGATIVE
Protein, UA: POSITIVE — AB
Spec Grav, UA: 1.015 (ref 1.010–1.025)
Urobilinogen, UA: 0.2 E.U./dL
pH, UA: 7 (ref 5.0–8.0)

## 2022-06-18 MED ORDER — ESTRADIOL 0.1 MG/GM VA CREA: TOPICAL_CREAM | VAGINAL | 12 refills | Status: AC

## 2022-06-18 MED ORDER — SUCRALFATE 1 GM/10ML PO SUSP: 1.0000 g | Freq: Three times a day (TID) | ORAL | 0 refills | Status: DC

## 2022-06-18 NOTE — Assessment & Plan Note (Signed)
Urine is not consistent with a UTI. 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 I will give her a trial of vaginal estradiol cream to see if this prevents the recurrent dysuria.

## 2022-06-18 NOTE — Assessment & Plan Note (Signed)
BP is still high today. I recommend she follow up soon with Dr. Grandville Silos for reassessment of her BP and potential addition of a diuretic.

## 2022-06-18 NOTE — Assessment & Plan Note (Signed)
We discussed that GERD could be the cause of her chronic cough. Her PPI is not managing this fully. I will add a 31-month course of sucralfate.

## 2022-06-18 NOTE — Progress Notes (Signed)
[facility] [address] [place] [address] Dept: [phone] Dept Fax: [phone]  Office Visit  Subjective:    Patient ID: Dawn Cuevas, female    DOB: Jan 23, 1956, 67 y.o..   MRN: [id number]  Chief Complaint  Patient presents with   Urinary Frequency    C/o having urine frequency, abdominal/back pain x 8-10 days.  Also still having cough at night, no OTC meds taken.    Medical Interpreter: Dorthula Rue [id number]  History of Present Illness:  Patient is in today for with multiple complaints.  She notes that she has been having intermittent, recurrent episodes of burning with urination. She has had some associated left abdomen/flank pain over the past 8-10 days, but admits the dysuria has been going on longer than this. Related to her abdomen. She has not had an nausea or vomiting. She denies fever. She has had some constipation. Although her med list includes Miralax, she has not been using this. She notes the abdomen pain seems to hurt more if she lays on her left side.  [name] also notes issues with an ongoing cough. I had seen her in Jan. with a viral URI. She notes her cough has continued since that time. It mainly occurs at night. She has a history of GERD and is managed on pantoprazole. She still finds she gets some heartburn despite this. She also notes some sore throat issues. She feels she needs a swab taken of her throat and is concerned that she is not getting treatment for this.  [name] has a history of hypertension. her blood pressure has been elevated at both of her last 2 appointments, thoguh both were illness related. She is managed on Exforge 10-320 mg daily.  Past Medical History: Patient Active Problem List   Diagnosis Date Noted   Genitourinary syndrome of menopause 06/18/2022   GERD without esophagitis 01/08/2021   Primary osteoarthritis of right knee 11/04/2018   Osteochondroma of right tibia  11/04/2018   Prediabetes 05/11/2018   Uses Spanish as primary spoken language 04/10/2018   Hyperlipidemia 04/10/2018   Essential hypertension 04/10/2018   Traumatic arthritis of left ankle 09/28/2015   Past Surgical History:  Procedure Laterality Date   L ankle external fixation Left 2016   Pt report through interpreter   Family History  Problem Relation Age of Onset   Heart disease Mother    Diabetes Father    Hypertension Father    Outpatient Medications Prior to Visit  Medication Sig Dispense Refill   amLODipine-valsartan (EXFORGE) 10-320 MG tablet Take 1 tablet by mouth daily. 90 tablet 0   pantoprazole (PROTONIX) 40 MG tablet Take 1 tablet (40 mg total) by mouth daily. 90 tablet 0   polyethylene glycol powder (GLYCOLAX/MIRALAX) 17 GM/SCOOP powder Take 17 g by mouth 2 (two) times daily as needed. 3350 g 1   simvastatin (ZOCOR) 10 MG tablet Take 1 tablet (10 mg total) by mouth every evening. 90 tablet 0   benzonatate (TESSALON) 200 MG capsule Take 1 capsule (200 mg total) by mouth 2 (two) times daily as needed for cough. 20 capsule 0   No facility-administered medications prior to visit.   Allergies  Allergen Reactions   Aspirin Shortness Of Breath    Other Reaction(s): Other (See Comments)  Pt states that she feels "desperate"     Objective:   Today's Vitals   06/18/22 1352  BP: (!) 144/90  Pulse: 73  Temp: 98 F (36.7 C)  TempSrc: Temporal  SpO2: 97%  Weight: 198 lb 6.4 oz (90 kg)  Height: 5' 2.5" (1.588 m)   Body mass index is 35.71 kg/m.   General: Well developed, well nourished. No acute distress. HEENT: Normocephalic, non-traumatic. PERRL, EOMI. Conjunctiva clear. Mucous membranes moist. Oropharynx clear. Good   dentition. Neck: Supple. No lymphadenopathy. No thyromegaly. Lungs: Clear to auscultation bilaterally. No wheezing, rales or rhonchi. Abdomen: Soft. Bowel sounds positive, normal pitch and frequency. No hepatosplenomegaly. Mild tenderness in  the left mid to   LUQ. No palpable masses. No rebound or guarding. Back: Straight. No CVA tenderness bilaterally. Psych: Alert and oriented. Normal mood and affect.  Health Maintenance Due  Topic Date Due   Medicare Annual Wellness (AWV)  Never done   Hepatitis C Screening  Never done   Zoster Vaccines- Shingrix (1 of 2) Never done   Pneumonia Vaccine 27+ Years old (1 of 1 - PCV) Never done   DEXA SCAN  Never done   MAMMOGRAM  05/15/2022     Lab Results: Component Ref Range & Units 14:18  Color, UA yellow  Clarity, UA cloudy  Glucose, UA Negative Negative  Bilirubin, UA neg  Ketones, UA neg  Spec Grav, UA 1.010 - 1.025 1.015  Blood, UA neg  pH, UA 5.0 - 8.0 7.0  Protein, UA Negative Positive Abnormal   Comment: 15 mg/dl  Urobilinogen, UA 0.2 or 1.0 E.U./dL 0.2  Nitrite, UA neg  Leukocytes, UA Negative Negative   Assessment & Plan:   Problem List Items Addressed This Visit       Cardiovascular and Mediastinum   Essential hypertension    BP is still high today. I recommend she follow up soon with Dr. Grandville Silos for reassessment of her BP and potential addition of a diuretic.        Digestive   GERD without esophagitis    We discussed that GERD could be the cause of her chronic cough. Her PPI is not managing this fully. I will add a 63-month course of sucralfate.      Relevant Medications   sucralfate (CARAFATE) 1 GM/10ML suspension     Genitourinary   Genitourinary syndrome of menopause - Primary    Urine is not consistent with a UTI. 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 I will give her a trial of vaginal estradiol cream to see if this prevents the recurrent dysuria.      Relevant Medications   estradiol (ESTRACE) 0.1 MG/GM vaginal cream   Other Relevant Orders   POCT Urinalysis Dipstick (Completed)   Other Visit Diagnoses     Chronic cough       Constipation, unspecified constipation type       I recommend she add a daily fiber supplement. I reviewed use of  Miralax to help when she has been 2 days with no bowel movement.       Return in about 4 weeks (around 07/16/2022) for Reassessment with PCP.   Haydee Salter, MD

## 2022-08-07 ENCOUNTER — Encounter: Payer: Self-pay | Admitting: Family Medicine

## 2022-08-07 ENCOUNTER — Ambulatory Visit (INDEPENDENT_AMBULATORY_CARE_PROVIDER_SITE_OTHER): Payer: Medicare (Managed Care) | Admitting: Family Medicine

## 2022-08-07 VITALS — BP 164/92 | HR 78 | Temp 97.7°F | Ht 62.5 in | Wt 195.4 lb

## 2022-08-07 DIAGNOSIS — Z780 Asymptomatic menopausal state: Secondary | ICD-10-CM

## 2022-08-07 DIAGNOSIS — R7303 Prediabetes: Secondary | ICD-10-CM

## 2022-08-07 DIAGNOSIS — N182 Chronic kidney disease, stage 2 (mild): Secondary | ICD-10-CM

## 2022-08-07 DIAGNOSIS — K219 Gastro-esophageal reflux disease without esophagitis: Secondary | ICD-10-CM | POA: Diagnosis not present

## 2022-08-07 DIAGNOSIS — I10 Essential (primary) hypertension: Secondary | ICD-10-CM

## 2022-08-07 DIAGNOSIS — R300 Dysuria: Secondary | ICD-10-CM | POA: Diagnosis not present

## 2022-08-07 DIAGNOSIS — Z1159 Encounter for screening for other viral diseases: Secondary | ICD-10-CM

## 2022-08-07 DIAGNOSIS — E785 Hyperlipidemia, unspecified: Secondary | ICD-10-CM

## 2022-08-07 DIAGNOSIS — M12572 Traumatic arthropathy, left ankle and foot: Secondary | ICD-10-CM

## 2022-08-07 DIAGNOSIS — N958 Other specified menopausal and perimenopausal disorders: Secondary | ICD-10-CM

## 2022-08-07 DIAGNOSIS — R809 Proteinuria, unspecified: Secondary | ICD-10-CM

## 2022-08-07 DIAGNOSIS — Z0000 Encounter for general adult medical examination without abnormal findings: Secondary | ICD-10-CM

## 2022-08-07 DIAGNOSIS — Z789 Other specified health status: Secondary | ICD-10-CM

## 2022-08-07 DIAGNOSIS — D72829 Elevated white blood cell count, unspecified: Secondary | ICD-10-CM

## 2022-08-07 DIAGNOSIS — Z1231 Encounter for screening mammogram for malignant neoplasm of breast: Secondary | ICD-10-CM

## 2022-08-07 MED ORDER — HYALO GYN VA GEL: 1.0000 | VAGINAL | 0 refills | Status: DC

## 2022-08-07 MED ORDER — DICLOFENAC SODIUM 1 % EX GEL: 4.0000 g | Freq: Four times a day (QID) | CUTANEOUS | 3 refills | Status: DC | PRN

## 2022-08-07 MED ORDER — ESOMEPRAZOLE MAGNESIUM 40 MG PO CPDR: DELAYED_RELEASE_CAPSULE | ORAL | 3 refills | Status: DC

## 2022-08-07 MED ORDER — AMLODIPINE-VALSARTAN-HCTZ 10-320-25 MG PO TABS: 1.0000 | ORAL_TABLET | Freq: Every day | ORAL | 3 refills | Status: DC

## 2022-08-07 MED ORDER — ESOMEPRAZOLE MAGNESIUM 40 MG PO CPDR: 40.0000 mg | DELAYED_RELEASE_CAPSULE | Freq: Every day | ORAL | 3 refills | Status: DC

## 2022-08-07 NOTE — Progress Notes (Signed)
Assessment  Assessment/Plan:   Problem List Items Addressed This Visit       Cardiovascular and Mediastinum   Essential hypertension    Plan: Discussed medication adjustment due to persistently elevated readings. Starting Exforge with hydrochlorothiazide for better control.      Relevant Medications   amLODIPine-Valsartan-HCTZ 10-320-25 MG TABS   Other Relevant Orders   TSH (Completed)   Lipid panel (Completed)   Hemoglobin A1c (Completed)   Microalbumin / creatinine urine ratio (Completed)   CBC with Differential/Platelet (Completed)   Comprehensive metabolic panel (Completed)   AMB Referral to Chronic Care Management Services     Digestive   GERD without esophagitis    Plan: Stopping pantoprazole, starting esomeprazole. Discussed taking the medication an hour before meals for maximum effectiveness.      Relevant Medications   esomeprazole (NEXIUM) 40 MG capsule   Other Relevant Orders   Vitamin D 1,25 dihydroxy (Completed)     Musculoskeletal and Integument   Traumatic arthritis of left ankle    Plan: Prescribed diclofenac gel to manage pain and inflammation.      Relevant Medications   diclofenac Sodium (VOLTAREN) 1 % GEL     Genitourinary   Genitourinary syndrome of menopause    Trial Hyalo gel Recommended referral to OBGYN for further evaluation due to atrophic symptoms.      Relevant Medications   Vaginal Lubricant (HYALO GYN) GEL   Other Relevant Orders   Ambulatory referral to Obstetrics / Gynecology   CKD (chronic kidney disease) stage 2, GFR 60-89 ml/min   Relevant Orders   AMB Referral to Chronic Care Management Services     Other   Uses Spanish as primary spoken language   Prediabetes    Plan: Continued monitoring and discussing lifestyle modifications.      Hyperlipidemia   Relevant Medications   amLODIPine-Valsartan-HCTZ 10-320-25 MG TABS   Class 2 severe obesity with serious comorbidity in adult [facility])   Other Visit Diagnoses      Proteinuria, unspecified type    -  Primary   Dysuria       Relevant Orders   Urinalysis w microscopic + reflex cultur (Completed)   Screening for viral disease       Postmenopausal estrogen deficiency       Relevant Orders   DG Bone Density   Screening mammogram for breast cancer       Relevant Orders   MM 3D SCREENING MAMMOGRAM BILATERAL BREAST   Encounter for well adult exam without abnormal findings           Medications Discontinued During This Encounter  Medication Reason   pantoprazole (PROTONIX) 40 MG tablet    amLODipine-valsartan (EXFORGE) 10-320 MG tablet    esomeprazole (NEXIUM) 40 MG capsule Reorder    Patient Counseling(The following topics were reviewed and/or handout was given):  -Nutrition: Stressed importance of moderation in sodium/caffeine intake, saturated fat and cholesterol, caloric balance, sufficient intake of fresh fruits, vegetables, and fiber.  -Stressed the importance of regular exercise.   -Substance Abuse: Discussed cessation/primary prevention of tobacco, alcohol, or other drug use; driving or other dangerous activities under the influence; availability of treatment for abuse.   -Injury prevention: Discussed safety belts, safety helmets, smoke detector, smoking near bedding or upholstery.   -Sexuality: Discussed sexually transmitted diseases, partner selection, use of condoms, avoidance of unintended pregnancy and contraceptive alternatives.   -Dental health: Discussed importance of regular tooth brushing, flossing, and dental visits.  -Health maintenance and immunizations  reviewed. Please refer to Health maintenance section.  Return to care in 1 year for next preventative visit.       Subjective:  Chief complaint Encounter date: 08/07/2022  Chief Complaint  Patient presents with   Annual Exam    Non fasting. Would like glucose and b12 levels. Pt will schedule for mobile mammogram. Byrd Hesselbach interpreting   History of Present Illness: Dawn Cuevas is a 67 y.o. female who presents today for her annual comprehensive physical exam. Patient reports symptoms related to urinary issues such as dysuria and frequent urination. Complaints also include persistent heartburn suspected to be due to GERD, and recurring pain in the left ankle due to a past fracture.  Review of Systems  Constitutional:  Negative for chills, diaphoresis, fever, malaise/fatigue and weight loss.  HENT:  Negative for congestion, ear discharge, ear pain and hearing loss.   Eyes:  Negative for blurred vision, double vision, photophobia, pain, discharge and redness.  Respiratory:  Negative for cough, sputum production, shortness of breath and wheezing.   Cardiovascular:  Negative for chest pain and palpitations.  Gastrointestinal:  Positive for heartburn. Negative for abdominal pain, blood in stool, constipation, diarrhea, melena, nausea and vomiting.  Genitourinary:  Positive for dysuria and frequency. Negative for flank pain, hematuria and urgency.  Musculoskeletal:  Positive for joint pain (Left ankle). Negative for myalgias.  Skin:  Negative for itching and rash.  Neurological:  Negative for dizziness, tingling, tremors, speech change, seizures, loss of consciousness, weakness and headaches.  Endo/Heme/Allergies:  Negative for polydipsia.  Psychiatric/Behavioral:  Negative for depression, hallucinations, memory loss, substance abuse and suicidal ideas. The patient does not have insomnia.   All other systems reviewed and are negative.      08/07/2022    3:26 PM 03/01/2022    4:25 PM  GAD-7 Generalized Anxiety Disorder Screening Tool  1. Feeling Nervous, Anxious, or on Edge 0 0  2. Not Being Able to Stop or Control Worrying 1 0  3. Worrying Too Much About Different Things 0 0  4. Trouble Relaxing 0 0  5. Being So Restless it's Hard To Sit Still 0 0  6. Becoming Easily Annoyed or Irritable 0 0  7. Feeling Afraid As If Something Awful Might Happen 0 0  Total  GAD-7 Score 1 0  Difficulty At Work, Home, or Getting  Along With Others? Not difficult at all Not difficult at all      08/07/2022    3:25 PM 03/01/2022    4:24 PM  Depression screen PHQ 2/9  Decreased Interest 0 0  Down, Depressed, Hopeless 0 0  PHQ - 2 Score 0 0  Altered sleeping 0 0  Tired, decreased energy 1 2  Change in appetite 0 0  Feeling bad or failure about yourself  0 0  Trouble concentrating 0 1  Moving slowly or fidgety/restless 0 0  Suicidal thoughts 0 0  PHQ-9 Score 1 3  Difficult doing work/chores Not difficult at all Somewhat difficult    Health Maintenance Due  Topic Date Due   Medicare Annual Wellness (AWV)  Never done   Hepatitis C Screening  Never done   Zoster Vaccines- Shingrix (1 of 2) Never done   Pneumonia Vaccine 20+ Years old (1 of 1 - PCV) Never done   DEXA SCAN  Never done   MAMMOGRAM  05/15/2022     Dental: UTD Vision: UTD   PMH:  The following were reviewed and entered/updated in epic: Past Medical History:  Diagnosis Date   Abnormal Pap smear of cervix    Hyperlipemia    Hypertension     Patient Active Problem List   Diagnosis Date Noted   Class 2 severe obesity with serious comorbidity in adult (HCC) 08/07/2022   CKD (chronic kidney disease) stage 2, GFR 60-89 ml/min 08/07/2022   Genitourinary syndrome of menopause 06/18/2022   GERD without esophagitis 01/08/2021   Primary osteoarthritis of right knee 11/04/2018   Osteochondroma of right tibia 11/04/2018   Prediabetes 05/11/2018   Uses Spanish as primary spoken language 04/10/2018   Hyperlipidemia 04/10/2018   Essential hypertension 04/10/2018   Traumatic arthritis of left ankle 09/28/2015    Past Surgical History:  Procedure Laterality Date   L ankle external fixation Left 2016   Pt report through interpreter    Family History  Problem Relation Age of Onset   Heart disease Mother    Diabetes Father    Hypertension Father     Medications- reviewed and  updated Outpatient Medications Prior to Visit  Medication Sig Dispense Refill   polyethylene glycol powder (GLYCOLAX/MIRALAX) 17 GM/SCOOP powder Take 17 g by mouth 2 (two) times daily as needed. (Patient not taking: Reported on 08/07/2022) 3350 g 1   simvastatin (ZOCOR) 10 MG tablet Take 1 tablet (10 mg total) by mouth every evening. 90 tablet 0   sucralfate (CARAFATE) 1 GM/10ML suspension Take 10 mLs (1 g total) by mouth 4 (four) times daily -  with meals and at bedtime. (Patient not taking: Reported on 08/07/2022) 420 mL 0   amLODipine-valsartan (EXFORGE) 10-320 MG tablet Take 1 tablet by mouth daily. 90 tablet 0   pantoprazole (PROTONIX) 40 MG tablet Take 1 tablet (40 mg total) by mouth daily. 90 tablet 0   No facility-administered medications prior to visit.    Allergies  Allergen Reactions   Aspirin Shortness Of Breath    Other Reaction(s): Other (See Comments)  Pt states that she feels "desperate"    Social History   Socioeconomic History   Marital status: Married    Spouse name: Not on file   Number of children: Not on file   Years of education: Not on file   Highest education level: Not on file  Occupational History   Not on file  Tobacco Use   Smoking status: Never    Passive exposure: Never   Smokeless tobacco: Never  Vaping Use   Vaping Use: Never used  Substance and Sexual Activity   Alcohol use: No   Drug use: No   Sexual activity: Yes    Birth control/protection: None  Other Topics Concern   Not on file  Social History Narrative   Not on file   Social Determinants of Health   Financial Resource Strain: Not on file  Food Insecurity: Not on file  Transportation Needs: Not on file  Physical Activity: Not on file  Stress: Not on file  Social Connections: Not on file        Objective:  Physical Exam: BP (!) 164/92 (BP Location: Left Arm, Patient Position: Sitting, Cuff Size: Large)   Pulse 78   Temp 97.7 F (36.5 C) (Temporal)   Ht 5' 2.5" (1.588  m)   Wt 195 lb 6.4 oz (88.6 kg)   SpO2 97%   BMI 35.17 kg/m   Body mass index is 35.17 kg/m. Wt Readings from Last 3 Encounters:  08/07/22 195 lb 6.4 oz (88.6 kg)  06/18/22 198 lb 6.4 oz (90 kg)  04/10/22  196 lb 12.8 oz (89.3 kg)    Physical Exam Constitutional:      General: She is not in acute distress.    Appearance: Normal appearance. She is not ill-appearing or toxic-appearing.  HENT:     Head: Normocephalic.     Right Ear: Tympanic membrane, ear canal and external ear normal. There is no impacted cerumen.     Left Ear: Tympanic membrane, ear canal and external ear normal. There is no impacted cerumen.     Nose: Nose normal. No congestion.     Mouth/Throat:     Mouth: Mucous membranes are moist.     Pharynx: Oropharynx is clear. No oropharyngeal exudate.  Eyes:     General: No scleral icterus.       Right eye: No discharge.        Left eye: No discharge.     Conjunctiva/sclera: Conjunctivae normal.     Pupils: Pupils are equal, round, and reactive to light.  Cardiovascular:     Rate and Rhythm: Normal rate and regular rhythm.     Pulses: Normal pulses.     Heart sounds: Normal heart sounds.  Pulmonary:     Effort: Pulmonary effort is normal. No respiratory distress.     Breath sounds: Normal breath sounds.  Abdominal:     General: Abdomen is flat. Bowel sounds are normal.     Palpations: Abdomen is soft.  Musculoskeletal:        General: Normal range of motion.     Cervical back: Normal range of motion.     Left ankle: Swelling present. Tenderness present.  Lymphadenopathy:     Cervical: No cervical adenopathy.  Skin:    General: Skin is warm and dry.     Findings: No rash.  Neurological:     General: No focal deficit present.     Mental Status: She is alert and oriented to person, place, and time. Mental status is at baseline.  Psychiatric:        Mood and Affect: Mood normal.        Behavior: Behavior normal.        Thought Content: Thought content  normal.        Judgment: Judgment normal.         At today's visit, we discussed treatment options, associated risk and benefits, and engage in counseling as needed.  Additionally the following were reviewed: Past medical records, past medical and surgical history, family and social background, as well as relevant laboratory results, imaging findings, and specialty notes, where applicable.  This message was generated using dictation software, and as a result, it may contain unintentional typos or errors.  Nevertheless, extensive effort was made to accurately convey at the pertinent aspects of the patient visit.    There may have been are other unrelated non-urgent complaints, but due to the busy schedule and the amount of time already spent with her, time does not permit to address these issues at today's visit. Another appointment may have or has been requested to review these additional issues.   Thomes Dinning, MD, MS

## 2022-08-07 NOTE — Patient Instructions (Signed)
Patient Instructions:  English: Blood Pressure: Monitor your blood pressure at home and record the values. Follow up in one month for re-evaluation.  Medications: Esomeprazole: Take 40 mg one hour before meals. New Blood Pressure Pill: Combination of Amlodipine, Valsartan, and Hydrochlorothiazide. Continue as directed. Vaginal Cream: Use the prescribed vaginal lubricant as needed. Diclofenac Gel: Apply to the left ankle for pain relief as directed. Follow-Up: Schedule an appointment with an OBGYN and follow their instructions. Lifestyle Modifications: Maintain a healthy diet, avoid beef if it raises blood pressure, and engage in regular physical activity. Spanish: Presin Arterial: Monitoree su presin arterial en casa y [employer]. Haga un seguimiento en un mes para re-evaluacin. Medicamentos: Esomeprazole: Tome 40 mg una hora antes de las comidas. Nueva [place] para la Presin Arterial: Combinacin de Psychologist, occupational, Theatre stage manager y Hidroclorotiazida. Contine segn las indicaciones. Crema Vaginal: Utilice el lubricante vaginal segn necesidad. Gel de Diclofenac: Aplique el gel en el tobillo izquierdo para Engineer, materials segn lo indicado. Seguimiento: Programe una cita con un gineclogo y siga sus instrucciones. Modificaciones en el [place] de Vida: Mantenga una dieta saludable, evite la carne de res si eleva su presin arterial, y realice actividad fsica regular. Follow-Up Appointments: Schedule a follow-up appointment in one month to recheck blood pressure. Referral to OBGYN for further evaluation of urinary symptoms and menopausal changes.

## 2022-08-08 LAB — CBC WITH DIFFERENTIAL/PLATELET
Basophils Absolute: 0.1 10*3/uL (ref 0.0–0.1)
Basophils Relative: 0.7 % (ref 0.0–3.0)
Eosinophils Absolute: 0.3 10*3/uL (ref 0.0–0.7)
Eosinophils Relative: 2.6 % (ref 0.0–5.0)
HCT: 45.2 % (ref 36.0–46.0)
Hemoglobin: 15 g/dL (ref 12.0–15.0)
Lymphocytes Relative: 27.9 % (ref 12.0–46.0)
Lymphs Abs: 3.7 10*3/uL (ref 0.7–4.0)
MCHC: 33.2 g/dL (ref 30.0–36.0)
MCV: 85.2 fl (ref 78.0–100.0)
Monocytes Absolute: 1 10*3/uL (ref 0.1–1.0)
Monocytes Relative: 7.5 % (ref 3.0–12.0)
Neutro Abs: 8.1 10*3/uL — ABNORMAL HIGH (ref 1.4–7.7)
Neutrophils Relative %: 61.3 % (ref 43.0–77.0)
Platelets: 192 10*3/uL (ref 150.0–400.0)
RBC: 5.31 Mil/uL — ABNORMAL HIGH (ref 3.87–5.11)
RDW: 13.9 % (ref 11.5–15.5)
WBC: 13.2 10*3/uL — ABNORMAL HIGH (ref 4.0–10.5)

## 2022-08-08 LAB — COMPREHENSIVE METABOLIC PANEL
ALT: 13 U/L (ref 0–35)
AST: 14 U/L (ref 0–37)
Albumin: 4.2 g/dL (ref 3.5–5.2)
Alkaline Phosphatase: 99 U/L (ref 39–117)
BUN: 11 mg/dL (ref 6–23)
CO2: 24 mEq/L (ref 19–32)
Calcium: 10.7 mg/dL — ABNORMAL HIGH (ref 8.4–10.5)
Chloride: 105 mEq/L (ref 96–112)
Creatinine, Ser: 0.73 mg/dL (ref 0.40–1.20)
GFR: 84.71 mL/min (ref >60.00)
Glucose, Bld: 84 mg/dL (ref 70–99)
Potassium: 4.1 mEq/L (ref 3.5–5.1)
Sodium: 138 mEq/L (ref 135–145)
Total Bilirubin: 0.3 mg/dL (ref 0.2–1.2)
Total Protein: 7.4 g/dL (ref 6.0–8.3)

## 2022-08-08 LAB — LIPID PANEL
Cholesterol: 226 mg/dL — ABNORMAL HIGH (ref 0–200)
HDL: 53.4 mg/dL (ref >39.00)
NonHDL: 172.49
Total CHOL/HDL Ratio: 4
Triglycerides: 232 mg/dL — ABNORMAL HIGH (ref 0.0–149.0)
VLDL: 46.4 mg/dL — ABNORMAL HIGH (ref 0.0–40.0)

## 2022-08-08 LAB — MICROALBUMIN / CREATININE URINE RATIO
Creatinine,U: 21.2 mg/dL
Microalb Creat Ratio: 7 mg/g (ref 0.0–30.0)
Microalb, Ur: 1.5 mg/dL (ref 0.0–1.9)

## 2022-08-08 LAB — LDL CHOLESTEROL, DIRECT: Direct LDL: 149 mg/dL

## 2022-08-08 LAB — TSH: TSH: 1 u[IU]/mL (ref 0.35–5.50)

## 2022-08-08 LAB — HEMOGLOBIN A1C: Hgb A1c MFr Bld: 6 % (ref 4.6–6.5)

## 2022-08-10 LAB — URINALYSIS W MICROSCOPIC + REFLEX CULTURE
Bacteria, UA: NONE SEEN /HPF
Bilirubin Urine: NEGATIVE
Glucose, UA: NEGATIVE
Hgb urine dipstick: NEGATIVE
Hyaline Cast: NONE SEEN /LPF
Ketones, ur: NEGATIVE
Leukocyte Esterase: NEGATIVE
Nitrites, Initial: NEGATIVE
Protein, ur: NEGATIVE
RBC / HPF: NONE SEEN /HPF (ref 0–2)
Specific Gravity, Urine: 1.005 (ref 1.001–1.035)
WBC, UA: NONE SEEN /HPF (ref 0–5)
pH: 6.5 (ref 5.0–8.0)

## 2022-08-10 LAB — NO CULTURE INDICATED

## 2022-08-10 LAB — VITAMIN D 1,25 DIHYDROXY
Vitamin D 1, 25 (OH)2 Total: 104 pg/mL — ABNORMAL HIGH (ref 18–72)
Vitamin D2 1, 25 (OH)2: <8 pg/mL
Vitamin D3 1, 25 (OH)2: 104 pg/mL

## 2022-08-11 MED ORDER — ROSUVASTATIN CALCIUM 40 MG PO TABS: 40.0000 mg | ORAL_TABLET | Freq: Every day | ORAL | 3 refills | Status: DC

## 2022-08-11 NOTE — Assessment & Plan Note (Signed)
Plan: Discussed medication adjustment due to persistently elevated readings. Starting Exforge with hydrochlorothiazide for better control.

## 2022-08-11 NOTE — Assessment & Plan Note (Signed)
Trial Hyalo gel Recommended referral to OBGYN for further evaluation due to atrophic symptoms.

## 2022-08-11 NOTE — Assessment & Plan Note (Signed)
Plan: Prescribed diclofenac gel to manage pain and inflammation.

## 2022-08-11 NOTE — Assessment & Plan Note (Signed)
Plan: Continued monitoring and discussing lifestyle modifications.

## 2022-08-11 NOTE — Assessment & Plan Note (Signed)
Plan: Stopping pantoprazole, starting esomeprazole. Discussed taking the medication an hour before meals for maximum effectiveness.

## 2022-08-12 ENCOUNTER — Telehealth: Payer: Self-pay

## 2022-08-12 ENCOUNTER — Other Ambulatory Visit: Payer: Self-pay

## 2022-08-12 MED ORDER — ROSUVASTATIN CALCIUM 40 MG PO TABS: 40.0000 mg | ORAL_TABLET | Freq: Every day | ORAL | 3 refills | Status: DC

## 2022-08-12 NOTE — Progress Notes (Signed)
  Chronic Care Management   Note  08/12/2022 Name: Dawn Cuevas MRN: [id number] DOB: 1955/12/02  Dawn Cuevas Dawn Cuevas is a 67 y.o. year old female who is a primary care patient of Garnette Gunner, MD. I reached out to Dawn Cuevas by phone today in response to a referral sent by Dawn Cuevas's PCP.  Dawn Cuevas was given information about Chronic Care Management services today including:  CCM service includes personalized support from designated clinical staff supervised by the physician, including individualized plan of care and coordination with other care providers 24/7 contact phone numbers for assistance for urgent and routine care needs. Service will only be billed when office clinical staff spend 20 minutes or more in a month to coordinate care. Only one practitioner may furnish and bill the service in a calendar month. The patient may stop CCM services at amy time (effective at the end of the month) by phone call to the office staff. The patient will be responsible for cost sharing (co-pay) or up to 20% of the service fee (after annual deductible is met)  Dawn Cuevas  agreedto scheduling an appointment with the CCM Pharmacist   Follow up plan: Patient agreed to scheduled appointment with Pharmacist on 08/13/2022 RNCM 08/26/2022(date/time).   Dawn Cuevas, RMA [facility]  [place], [place] [address] Direct Dial: [phone] [email or web address]

## 2022-08-12 NOTE — Addendum Note (Signed)
Addended by: Trudee Kuster on: 08/12/2022 09:57 AM   Modules accepted: Orders

## 2022-08-13 ENCOUNTER — Other Ambulatory Visit: Payer: Medicare (Managed Care)

## 2022-08-13 ENCOUNTER — Telehealth: Payer: Self-pay

## 2022-08-13 NOTE — Progress Notes (Signed)
08/13/2022 Name: Dawn Cuevas MRN: [id number] DOB: [birth year]  Chief Complaint  Patient presents with   Hypertension   Dawn Cuevas is a 67 y.o. year old female who presented for a telephone visit.   They were referred to the pharmacist by their PCP for assistance in managing hypertension.   Patient is participating in a Managed Medicaid Plan:  No  Subjective: Telephone visit to discuss medication management of chronic disease states, specifically HTN in the setting of CKD. Care Team: Primary Care Provider: Garnette Gunner, MD ; Next Scheduled Visit: 09/04/22  Medication Access/Adherence Current Pharmacy:  [facility], [place] - [address] [place] [address] Phone: [phone] Fax: [phone]  -Patient reports affordability concerns with their medications: Yes  -Patient reports access/transportation concerns to their pharmacy: No  -Patient reports adherence concerns with their medications:  No   -Patient states diclofenac and vaginal lubricant recently prescribed are not covered by insurance, and the pharmacy cannot get the vaginal gel -Pantoprazole 40mg  daily was recently changed to esomeprazole 40mg  daily; but esomeprazole is $25 versus $0 for pantoprazole  Hypertension: Current medications: amlodipine/valsartan 10-320mg  daily, hctz 25mg  daily -Patient does not have a validated, automated, upper arm home BP cuff -Patient previously only taking amlodipine/valsartan 10-320mg  daily, but hctz was added to combination at recent OV due to elevated BP -Patient stated she had not received the triple therapy yet; and upon contacting pharmacy, they had refilled the amlodipine/valsartan and filled hctz 25mg  daily as separate prescriptions.  This was in an effort to reduce patient cost. -Medication(s) should be delivered to patient tomorrow  Hyperlipidemia/ASCVD Risk Reduction Current lipid lowering medications: rosuvastatin  40mg  daily  -Medications tried in the past: simvastatin 10mg  daily Antiplatelet regimen: None -Patient states she was unaware of a change from simvastatin 10mg  to 40mg  daily -Contacted pharmacy to verify they had the updated medication.  They do, and it is no charge to the patient.  This will be delivered to her tomorrow, also.  Objective: Lab Results  Component Value Date   HGBA1C 6.0 08/07/2022   Lab Results  Component Value Date   CREATININE 0.73 08/07/2022   BUN 11 08/07/2022   NA 138 08/07/2022   K 4.1 08/07/2022   CL 105 08/07/2022   CO2 24 08/07/2022   Lab Results  Component Value Date   CHOL 226 (H) 08/07/2022   HDL 53.40 08/07/2022   LDLDIRECT 149.0 08/07/2022   TRIG 232.0 (H) 08/07/2022   CHOLHDL 4 08/07/2022   Medications Reviewed Today     Reviewed by Trudee Kuster, CMA (Certified Medical Assistant) on 08/07/22 at 1356  Med List Status: <None>   Medication Order Taking? Sig Documenting Provider Last Dose Status Informant  amLODipine-valsartan (EXFORGE) 10-320 MG tablet [id number]  Take 1 tablet by mouth daily. Garnette Gunner, MD  Active   pantoprazole (PROTONIX) 40 MG tablet [id number]  Take 1 tablet (40 mg total) by mouth daily. Garnette Gunner, MD  Active   polyethylene glycol powder [facility]) 17 GM/SCOOP powder [id number] No Take 17 g by mouth 2 (two) times daily as needed.  Patient not taking: Reported on 08/07/2022   Garnette Gunner, MD Not Taking Active   simvastatin (ZOCOR) 10 MG tablet [id number]  Take 1 tablet (10 mg total) by mouth every evening. Garnette Gunner, MD  Active   sucralfate (CARAFATE) 1 GM/10ML suspension [id number] No Take 10 mLs (1 g total) by mouth 4 (four) times daily -  with meals and at bedtime.  Patient not taking: Reported on 08/07/2022   Loyola Mast, MD Not Taking Active            Assessment/Plan:   Medication Access/Adherence - Informed patient that diclofenac and lubricating vaginal gel can be  purchased OTC or through [employer] (approx $10/ea); she plans to have daughter assist with obtaining from [place] - Will discuss desire to try esomprazole therapy for at least 1 month versus going back to pantoprazole.  Hypertension: - Currently uncontrolled - Will educate patient on new medication regimen, which essentially just added on hctz 25mg  daily - Informing patient to contact insurance plan [phone]), because they have an OTC benefit that will provide funding for an automated, upper-arm blood pressure monitor  - Recommend to checking BP daily and record  Hyperlipidemia/ASCVD Risk Reduction: - Currently uncontrolled.  - Educated patient on statin therapy change - Recommend follow-up lipids and LFT's in 8-12 weeks  Follow Up Plan: Attempted to call patient to discuss follow-ups but had to leave a voicemail with my direct number to call me back at her convenience.  Will attempt outreach again tomorrow and will try to schedule 4 week follow-up to check on home BP  Lenna Gilford, PharmD, DPLA

## 2022-08-13 NOTE — Telephone Encounter (Signed)
Received fax request from [facility] requesting PA for diclofenac sodium 1% and esomeprazole magnesium 40 mg. Please advise.

## 2022-08-14 ENCOUNTER — Telehealth: Payer: Self-pay

## 2022-08-14 NOTE — Progress Notes (Signed)
   08/14/2022  Patient ID: Dawn Cuevas, female   DOB: 1955-10-04, 67 y.o.   MRN: [id number]  Outreach attempt to follow-up after telephone visit yesterday, but I was unable to make contact with patient.  [employer] interpreter present on the line and left voicemail with  my direct call back number.  Will try to contact patient again next week if I do not hear back.  Lenna Gilford, PharmD, DPLA

## 2022-08-20 ENCOUNTER — Other Ambulatory Visit (HOSPITAL_COMMUNITY): Payer: Self-pay

## 2022-08-20 ENCOUNTER — Telehealth: Payer: Self-pay

## 2022-08-20 NOTE — Telephone Encounter (Signed)
Pharmacy Patient Advocate Encounter   Received notification that prior authorization for Esomeprazole 40mg  is required/requested.   PA submitted on 08/20/22 to (ins) [employer] via CoverMyMeds Key  # [id number] Status is prior authorization not needed. Druge is covered by current benefit plan.

## 2022-08-20 NOTE — Telephone Encounter (Signed)
Pharmacy Patient Advocate Encounter   Received notification that prior authorization for Diclofenac sodium 1% gel is required/requested.   PA submitted on 08/20/22 to (ins)  [employer]  via CoverMyMeds Key  # [id number]  Prior Authorization has been approved.  PA#  [id number] Effective dates: 07/21/22 through 08/20/23  Per [facility] test claim, copay for 7 days supply is $7.45

## 2022-08-21 ENCOUNTER — Telehealth: Payer: Self-pay

## 2022-08-21 NOTE — Progress Notes (Signed)
   08/21/2022  Patient ID: Dawn Cuevas, female   DOB: 08-05-1955, 67 y.o.   MRN: [id number]  Outreach attempt with [employer] for Spanish on the line.  Contacting patient to follow-up on our previous telephone visit to:  make sure she had received the hctz to be taken along with her amlodipine/valsartan; see if a BP cuff was obtained through insurance OTC benefit; verify she picked up the simvastatin 40mg  prescription; and discuss insurance coverage of diclofenac and esomeprazole.  Unable to reach patient, but the interpreter left a message with my direct number for patient to return my call.  Will attempt outreach once more next week.  Lenna Gilford, PharmD, DPLA

## 2022-08-23 NOTE — Telephone Encounter (Signed)
Pt notified via letter.

## 2022-08-26 ENCOUNTER — Telehealth: Payer: Medicare (Managed Care)

## 2022-08-29 ENCOUNTER — Ambulatory Visit: Payer: Medicare (Managed Care)

## 2022-09-02 ENCOUNTER — Telehealth: Payer: Self-pay

## 2022-09-02 NOTE — Progress Notes (Signed)
   09/02/2022  Patient ID: Dawn Cuevas, female   DOB: April 05, 1955, 67 y.o.   MRN: [id number]  Patient outreach with [facility] Interpreter on the line to follow-up on recent medication changes and access to medications.  Patient was cooking dinner, so we scheduled telephone visit for tomorrow morning at 1130am.  Lenna Gilford, PharmD, DPLA

## 2022-09-03 ENCOUNTER — Other Ambulatory Visit: Payer: Medicare (Managed Care)

## 2022-09-03 NOTE — Progress Notes (Unsigned)
   09/03/2022  Patient ID: Dawn Cuevas, female   DOB: Oct 19, 1955, 67 y.o.   MRN: [id number]  Telephone follow-up along with Spanish interpreter through [facility] [employer] to check on recent medication changes.  -Patient is taking hctz 25mg  along with exforge 10/320mg  daily- states tolerating well with no adverse effects -Does not check BP at home, because she does not have working monitor.  She does have OTC medicare catalog to order items from.  I suggested an automated, upper-arm monitor/cuff. -She has stopped simvastatin and is taking rosuvastatin 40mg  daily.  She inquired about administration time, and I informed her that due to the efficacy of this statin, she can take at any time of day as long as timing is consistent. -Inquiring about recent lab work results, which I reviewed with her.  She is requesting a print out of recent labwork, which I will ask that PCP office mail to her.  States this was sent out previously, but she lost the papers. -She has not yet received her Diclofenac topical gel, so I informed her a PA was completed by PCP office.  Medication should go through for approximately $7-8 now.  Patient is going to call pharmacy to request fill. -She was able to pick up her omeprazole 40mg  daily prescription and is taking daily   Follow-Up:  None scheduled, but she sees Dr. Janee Morn 7/5; and I can follow-up as needed per his recommendations.  Lenna Gilford, PharmD, DPLA

## 2022-09-04 ENCOUNTER — Ambulatory Visit: Payer: Medicare (Managed Care) | Admitting: Family Medicine

## 2022-09-04 ENCOUNTER — Telehealth: Payer: Self-pay

## 2022-09-04 NOTE — Telephone Encounter (Signed)
A user error has taken place: encounter opened in error, closed for administrative reasons.

## 2022-09-05 ENCOUNTER — Ambulatory Visit: Payer: Medicare (Managed Care)

## 2022-09-05 ENCOUNTER — Telehealth: Payer: Self-pay | Admitting: Family Medicine

## 2022-09-05 DIAGNOSIS — K219 Gastro-esophageal reflux disease without esophagitis: Secondary | ICD-10-CM

## 2022-09-05 NOTE — Telephone Encounter (Signed)
Pt walked in and said that esomeprazole (NEXIUM) 40 MG capsule [[id number]]  makes her dizzy is there any other med she can use besides this.something that can be covered under her insurance plan

## 2022-09-06 ENCOUNTER — Other Ambulatory Visit: Payer: Self-pay | Admitting: Family Medicine

## 2022-09-06 MED ORDER — OMEPRAZOLE 40 MG PO CPDR: 40.0000 mg | DELAYED_RELEASE_CAPSULE | Freq: Every day | ORAL | 3 refills | Status: DC

## 2022-09-09 MED ORDER — RABEPRAZOLE SODIUM 20 MG PO TBEC: 20.0000 mg | DELAYED_RELEASE_TABLET | Freq: Every day | ORAL | 0 refills | Status: DC

## 2022-09-09 NOTE — Telephone Encounter (Signed)
Reached out to patient with interpreter  [phone] and provided them with annotation below. Patient stated that she received a letter stated doesn't show necessity of medication (omeprazole) and was denied due to her insurance. Please advise.

## 2022-09-09 NOTE — Telephone Encounter (Signed)
Reached out to patient with interpreter Blossom Hoops  [phone] and advised her of annotation below.

## 2022-09-17 ENCOUNTER — Ambulatory Visit
Admission: RE | Admit: 2022-09-17 | Discharge: 2022-09-17 | Disposition: A | Discharge status: Home / Self Care | Payer: Medicare (Managed Care) | Source: Ambulatory Visit | Attending: Family Medicine | Admitting: Family Medicine

## 2022-09-17 DIAGNOSIS — Z1231 Encounter for screening mammogram for malignant neoplasm of breast: Secondary | ICD-10-CM

## 2022-09-23 ENCOUNTER — Other Ambulatory Visit: Payer: Self-pay | Admitting: Family Medicine

## 2022-09-23 DIAGNOSIS — R928 Other abnormal and inconclusive findings on diagnostic imaging of breast: Secondary | ICD-10-CM

## 2022-09-27 ENCOUNTER — Ambulatory Visit: Payer: Medicare (Managed Care) | Admitting: Family Medicine

## 2022-09-30 ENCOUNTER — Encounter: Payer: Self-pay | Admitting: Family Medicine

## 2022-09-30 ENCOUNTER — Ambulatory Visit (INDEPENDENT_AMBULATORY_CARE_PROVIDER_SITE_OTHER): Payer: Medicare (Managed Care) | Admitting: Family Medicine

## 2022-09-30 ENCOUNTER — Telehealth: Payer: Self-pay | Admitting: Family Medicine

## 2022-09-30 VITALS — BP 142/84 | HR 74 | Temp 97.2°F | Wt 191.2 lb

## 2022-09-30 DIAGNOSIS — M12572 Traumatic arthropathy, left ankle and foot: Secondary | ICD-10-CM

## 2022-09-30 DIAGNOSIS — K219 Gastro-esophageal reflux disease without esophagitis: Secondary | ICD-10-CM | POA: Diagnosis not present

## 2022-09-30 DIAGNOSIS — I10 Essential (primary) hypertension: Secondary | ICD-10-CM | POA: Diagnosis not present

## 2022-09-30 DIAGNOSIS — N182 Chronic kidney disease, stage 2 (mild): Secondary | ICD-10-CM

## 2022-09-30 MED ORDER — RABEPRAZOLE SODIUM 20 MG PO TBEC
20.0000 mg | DELAYED_RELEASE_TABLET | Freq: Every day | ORAL | 0 refills | Status: DC
Start: 2022-09-30 — End: 2022-11-08

## 2022-09-30 NOTE — Patient Instructions (Signed)
Instructions in English:  Referral for Foot Pain: You will be referred to an ankle specialist (podiatry) for further evaluation of your left ankle. They will determine if specific imaging or other treatments are required.  Blood Pressure Monitoring: Your blood pressure medications will remain the same for now. Please check your blood pressure at home daily when you are at rest. Return for a follow-up in one month, with a log of your blood pressure readings.  Cholesterol and GERD Medication: Continue taking rosuvastatin (40 mg) for cholesterol. Stop taking omeprazole and start taking rabeprazole (20 mg) for GERD.  Coordination with Chronic Care Management: You will be referred to chronic care management services to help coordinate your treatment and follow-ups.  Instructions in Spanish:  Remisin para Chief Technology Officer del Pie: Se le remitir a Proofreader tobillo (podiatra) para una evaluacin ms detallada de su tobillo izquierdo. Ellos determinarn si se requieren imgenes especficas u otros tratamientos.  Monitoreo de la Presin Arterial: Sus medicamentos para la presin arterial permanecern igual por ahora. Por favor, verifique su presin arterial en casa diariamente cuando est en reposo. Regrese para un seguimiento en un mes con un registro de sus lecturas de presin arterial.  Medicamentos para el Colesterol y Reflujo Gastroesofgico: Contine tomando rosuvastatina (40 mg) para el colesterol. Deje de tomar omeprazol y comience a tomar rabeprazol (20 mg) para el reflujo.  Coordinacin con Manejo de Cuidados Crnicos: Ser remitida a servicios de manejo de cuidados crnicos para ayudar a coordinar su tratamiento y seguimientos.

## 2022-09-30 NOTE — Progress Notes (Unsigned)
Assessment/Plan:   Problem List Items Addressed This Visit   None   There are no discontinued medications.  No follow-ups on file.    Subjective:   Encounter date: 09/30/2022  Dawn Cuevas is a 67 y.o. female who has Uses Spanish as primary spoken language; Traumatic arthritis of left ankle; Primary osteoarthritis of right knee; Prediabetes; Osteochondroma of right tibia; Hyperlipidemia; GERD without esophagitis; Essential hypertension; Genitourinary syndrome of menopause; Class 2 severe obesity with serious comorbidity in adult [facility]); and CKD (chronic kidney disease) stage 2, GFR 60-89 ml/min on their problem list..   She  has a past medical history of Abnormal Pap smear of cervix, Hyperlipemia, and Hypertension..   She presents with chief complaint of Medical Management of Chronic Issues (B/P f/u. ) .   HPI:   ROS  Past Surgical History:  Procedure Laterality Date   L ankle external fixation Left 2016   Pt report through interpreter    Outpatient Medications Prior to Visit  Medication Sig Dispense Refill   amLODipine-valsartan (EXFORGE) 10-320 MG tablet Take 1 tablet by mouth daily.     hydrochlorothiazide (HYDRODIURIL) 25 MG tablet Take 25 mg by mouth daily.     RABEprazole (ACIPHEX) 20 MG tablet Take 1 tablet (20 mg total) by mouth daily. 90 tablet 0   rosuvastatin (CRESTOR) 40 MG tablet Take 1 tablet (40 mg total) by mouth daily. 90 tablet 3   diclofenac Sodium (VOLTAREN) 1 % GEL Apply 4 g topically 4 (four) times daily as needed. (Patient not taking: Reported on 08/13/2022) 100 g 3   polyethylene glycol powder (GLYCOLAX/MIRALAX) 17 GM/SCOOP powder Take 17 g by mouth 2 (two) times daily as needed. (Patient not taking: Reported on 08/07/2022) 3350 g 1   Vaginal Lubricant (HYALO GYN) GEL Place 1 Application vaginally every 3 (three) days. (Patient not taking: Reported on 08/13/2022) 30 g 0   No facility-administered medications prior to visit.    Family  History  Problem Relation Age of Onset   Heart disease Mother    Diabetes Father    Hypertension Father     Social History   Socioeconomic History   Marital status: Married    Spouse name: Not on file   Number of children: Not on file   Years of education: Not on file   Highest education level: Not on file  Occupational History   Not on file  Tobacco Use   Smoking status: Never    Passive exposure: Never   Smokeless tobacco: Never  Vaping Use   Vaping Use: Never used  Substance and Sexual Activity   Alcohol use: No   Drug use: No   Sexual activity: Yes    Birth control/protection: None  Other Topics Concern   Not on file  Social History Narrative   Not on file   Social Determinants of Health   Financial Resource Strain: Not on file  Food Insecurity: Not on file  Transportation Needs: Not on file  Physical Activity: Not on file  Stress: Not on file  Social Connections: Not on file  Intimate Partner Violence: Not on file  Objective:  Physical Exam: BP (!) 144/86 (BP Location: Left Arm, Patient Position: Sitting, Cuff Size: Large)   Pulse 74   Temp (!) 97.2 F (36.2 C) (Temporal)   Wt 191 lb 3.2 oz (86.7 kg)   SpO2 98%   BMI 34.41 kg/m     Physical Exam  MM 3D SCREENING MAMMOGRAM BILATERAL BREAST  Result Date: 09/20/2022 CLINICAL DATA:  Screening. EXAM: DIGITAL SCREENING BILATERAL MAMMOGRAM WITH TOMOSYNTHESIS AND CAD TECHNIQUE: Bilateral screening digital craniocaudal and mediolateral oblique mammograms were obtained. Bilateral screening digital breast tomosynthesis was performed. The images were evaluated with computer-aided detection. COMPARISON:  Previous exam(s). ACR Breast Density Category c: The breasts are heterogeneously dense, which may obscure small masses. FINDINGS: In the right breast a possible asymmetry requires further evaluation. In the left  breast a possible asymmetry requires further evaluation. IMPRESSION: Further evaluation is suggested for possible asymmetry in the right breast. Further evaluation is suggested for possible asymmetry in the left breast. RECOMMENDATION: Diagnostic mammogram and possibly ultrasound of both breasts. (Code:[id number]-62M) The patient will be contacted regarding the findings, and additional imaging will be scheduled. BI-RADS CATEGORY  0: Incomplete: Need additional imaging evaluation. Electronically Signed   By: Emmaline Kluver M.D.   On: 09/20/2022 12:45    Recent Results (from the past 2160 hour(s))  TSH     Status: None   Collection Time: 08/07/22  2:59 PM  Result Value Ref Range   TSH 1.00 0.35 - 5.50 uIU/mL  Lipid panel     Status: Abnormal   Collection Time: 08/07/22  2:59 PM  Result Value Ref Range   Cholesterol 226 (H) 0 - 200 mg/dL    Comment: ATP III Classification       Desirable:  < 200 mg/dL               Borderline High:  200 - 239 mg/dL          High:  > = 010 mg/dL   Triglycerides 272.5 (H) 0.0 - 149.0 mg/dL    Comment: Normal:  <366 mg/dLBorderline High:  150 - 199 mg/dL   HDL 44.03 >47.42 mg/dL   VLDL 59.5 (H) 0.0 - 63.8 mg/dL   Total CHOL/HDL Ratio 4     Comment:                Men          Women1/2 Average Risk     3.4          3.3Average Risk          5.0          4.42X Average Risk          9.6          7.13X Average Risk          15.0          11.0                       NonHDL 172.49     Comment: NOTE:  Non-HDL goal should be 30 mg/dL higher than patient's LDL goal (i.e. LDL goal of < 70 mg/dL, would have non-HDL goal of < 100 mg/dL)  Hemoglobin V5I     Status: None   Collection Time: 08/07/22  2:59 PM  Result Value Ref Range   Hgb A1c MFr Bld 6.0 4.6 - 6.5 %    Comment: Glycemic Control Guidelines for People with Diabetes:Non Diabetic:  <6%Goal of  Therapy: <7%Additional Action Suggested:  >8%   Microalbumin / creatinine urine ratio     Status: None   Collection Time: 08/07/22   2:59 PM  Result Value Ref Range   Microalb, Ur 1.5 0.0 - 1.9 mg/dL   Creatinine,U 16.1 mg/dL   Microalb Creat Ratio 7.0 0.0 - 30.0 mg/g  Vitamin D 1,25 dihydroxy     Status: Abnormal   Collection Time: 08/07/22  2:59 PM  Result Value Ref Range   Vitamin D 1, 25 (OH)2 Total 104 (H) 18 - 72 pg/mL   Vitamin D3 1, 25 (OH)2 104 pg/mL   Vitamin D2 1, 25 (OH)2 <8 pg/mL    Comment: (Note) Vitamin D3, 1,25(OH)2 indicates both endogenous  production and supplementation. Vitamin D2, 1,25(OH)2 is  an indicator of exogenous sources, such as diet or  supplementation. Interpretation and therapy are based on  measurement of Vitamin D, 1,25 (OH)2, Total. . This test was developed, and its analytical performance  characteristics have been determined by [employer]. It has not been cleared or approved by the  FDA. This assay has been validated pursuant to the CLIA  regulations and is used for clinical purposes. . For additional information, please refer to http://education.[facility].com/faq/FAQ199 (This link is being provided for  informational/educational purposes only.) . [facility] [address] [place] [address] [phone] Junita Push L. Claudene Gatliff Caul, MD, PhD   CBC with Differential/Platelet     Status: Abnormal   Collection Time: 08/07/22  2:59 PM  Result Value Ref Range   WBC 13.2 (H) 4.0 - 10.5 K/uL   RBC 5.31 (H) 3.87 - 5.11 Mil/uL   Hemoglobin 15.0 12.0 - 15.0 g/dL   HCT 78.2 95.6 - 21.3 %   MCV 85.2 78.0 - 100.0 fl   MCHC 33.2 30.0 - 36.0 g/dL   RDW 08.6 57.8 - 46.9 %   Platelets 192.0 150.0 - 400.0 K/uL   Neutrophils Relative % 61.3 43.0 - 77.0 %   Lymphocytes Relative 27.9 12.0 - 46.0 %   Monocytes Relative 7.5 3.0 - 12.0 %   Eosinophils Relative 2.6 0.0 - 5.0 %   Basophils Relative 0.7 0.0 - 3.0 %   Neutro Abs 8.1 (H) 1.4 - 7.7 K/uL   Lymphs Abs 3.7 0.7 - 4.0 K/uL   Monocytes Absolute 1.0 0.1 - 1.0 K/uL   Eosinophils Absolute  0.3 0.0 - 0.7 K/uL   Basophils Absolute 0.1 0.0 - 0.1 K/uL  Comprehensive metabolic panel     Status: Abnormal   Collection Time: 08/07/22  2:59 PM  Result Value Ref Range   Sodium 138 135 - 145 mEq/L   Potassium 4.1 3.5 - 5.1 mEq/L   Chloride 105 96 - 112 mEq/L   CO2 24 19 - 32 mEq/L   Glucose, Bld 84 70 - 99 mg/dL   BUN 11 6 - 23 mg/dL   Creatinine, Ser 6.29 0.40 - 1.20 mg/dL   Total Bilirubin 0.3 0.2 - 1.2 mg/dL   Alkaline Phosphatase 99 39 - 117 U/L   AST 14 0 - 37 U/L   ALT 13 0 - 35 U/L   Total Protein 7.4 6.0 - 8.3 g/dL   Albumin 4.2 3.5 - 5.2 g/dL   GFR 52.84 >13.24 mL/min    Comment: Calculated using the CKD-EPI Creatinine Equation (2021)   Calcium 10.7 (H) 8.4 - 10.5 mg/dL  Urinalysis w microscopic + reflex cultur     Status: None   Collection Time:  08/07/22  2:59 PM   Specimen: Blood  Result Value Ref Range   Color, Urine YELLOW YELLOW   APPearance CLEAR CLEAR   Specific Gravity, Urine 1.005 1.001 - 1.035   pH 6.5 5.0 - 8.0   Glucose, UA NEGATIVE NEGATIVE   Bilirubin Urine NEGATIVE NEGATIVE   Ketones, ur NEGATIVE NEGATIVE   Hgb urine dipstick NEGATIVE NEGATIVE   Protein, ur NEGATIVE NEGATIVE   Nitrites, Initial NEGATIVE NEGATIVE   Leukocyte Esterase NEGATIVE NEGATIVE   WBC, UA NONE SEEN 0 - 5 /HPF   RBC / HPF NONE SEEN 0 - 2 /HPF   Squamous Epithelial / HPF 0-5 < OR = 5 /HPF   Bacteria, UA NONE SEEN NONE SEEN /HPF   Hyaline Cast NONE SEEN NONE SEEN /LPF   Note      Comment: This urine was analyzed for the presence of WBC,  RBC, bacteria, casts, and other formed elements.  Only those elements seen were reported. . .   REFLEXIVE URINE CULTURE     Status: None   Collection Time: 08/07/22  2:59 PM  Result Value Ref Range   Reflexve Urine Culture      Comment: NO CULTURE INDICATED  LDL cholesterol, direct     Status: None   Collection Time: 08/07/22  2:59 PM  Result Value Ref Range   Direct LDL 149.0 mg/dL    Comment: Optimal:  <161 mg/dLNear or Above  Optimal:  100-129 mg/dLBorderline High:  130-159 mg/dLHigh:  160-189 mg/dLVery High:  >190 mg/dL        Garner Nash, MD, MS

## 2022-10-01 NOTE — Assessment & Plan Note (Signed)
Symptoms of heartburn, issues with insurance coverage for medications including omeprazole.  Plan: Discontinue omeprazole. Start rabeprazole 20 mg daily. Monitor for improvement of symptoms.

## 2022-10-01 NOTE — Assessment & Plan Note (Signed)
Blood pressure still slightly elevated despite medication. Recent readings improved to 144/86 mmHg.  Plan: Continue current medication regimen: amlodipine 10 mg, valsartan 320 mg, hydrochlorothiazide 25 mg. Monitor blood pressure at home; record daily. Follow up in one month for reassessment. Coordinate chronic care management to ensure follow-up on the referral and blood pressure check.

## 2022-10-01 NOTE — Assessment & Plan Note (Signed)
Chronic pain and swelling in the left ankle, noted improvement with physical therapy and chiropractic care. Seeking further evaluation.  Plan: Referral to podiatry for specialist evaluation. Discuss options for pain management and potential orthotics.

## 2022-10-01 NOTE — Telephone Encounter (Signed)
error 

## 2022-10-09 ENCOUNTER — Telehealth: Payer: Self-pay

## 2022-10-09 NOTE — Progress Notes (Signed)
   Care Guide Note  10/09/2022 Name: Dawn Cuevas MRN: [id number] DOB: 03/03/1956  Referred by: Garnette Gunner, MD Reason for referral : Care Coordination (Outreach to schedule with Pharm d )   Dawn Cuevas is a 67 y.o. year old female who is a primary care patient of Garnette Gunner, MD. Dawn Cuevas was referred to the pharmacist for assistance related to DM.    An unsuccessful telephone outreach was attempted today to contact the patient who was referred to the pharmacy team for assistance with medication assistance. Additional attempts will be made to contact the patient.   Penne Lash, RMA Care Guide [facility]  [place], [place] [address] Direct Dial: [phone] [name]

## 2022-10-18 ENCOUNTER — Encounter: Payer: Self-pay | Admitting: Podiatry

## 2022-10-18 ENCOUNTER — Ambulatory Visit: Payer: Medicare (Managed Care) | Admitting: Podiatry

## 2022-10-23 NOTE — Progress Notes (Signed)
   Care Guide Note  10/23/2022 Name: Dawn Cuevas MRN: [id number] DOB: [birth year]  Referred by: Garnette Gunner, MD Reason for referral : Care Coordination (Outreach to schedule with Pharm d )   Dawn Cuevas is a 67 y.o. year old female who is a primary care patient of Garnette Gunner, MD. Dawn Cuevas was referred to the pharmacist for assistance related to HTN and HLD.    Successful contact was made with the patient to discuss pharmacy services including being ready for the pharmacist to call at least 5 minutes before the scheduled appointment time, to have medication bottles and any blood sugar or blood pressure readings ready for review. The patient agreed to meet with the pharmacist via with the pharmacist via telephone visit on (date/time).  11/06/2022  Penne Lash, RMA Care Guide [facility]  [place], [place] [address] Direct Dial: [phone] [email or web address]

## 2022-10-31 ENCOUNTER — Other Ambulatory Visit: Payer: Medicare (Managed Care)

## 2022-11-01 ENCOUNTER — Ambulatory Visit: Payer: Medicare (Managed Care) | Admitting: Podiatry

## 2022-11-06 ENCOUNTER — Ambulatory Visit: Payer: Medicare (Managed Care) | Admitting: Podiatry

## 2022-11-06 ENCOUNTER — Ambulatory Visit (INDEPENDENT_AMBULATORY_CARE_PROVIDER_SITE_OTHER): Payer: Medicare (Managed Care)

## 2022-11-06 ENCOUNTER — Encounter: Payer: Self-pay | Admitting: Podiatry

## 2022-11-06 ENCOUNTER — Other Ambulatory Visit: Payer: Medicare (Managed Care)

## 2022-11-06 DIAGNOSIS — M12572 Traumatic arthropathy, left ankle and foot: Secondary | ICD-10-CM

## 2022-11-06 DIAGNOSIS — M19072 Primary osteoarthritis, left ankle and foot: Secondary | ICD-10-CM

## 2022-11-06 NOTE — Progress Notes (Signed)
Subjective:  Patient ID: Dawn Cuevas, female    DOB: 1955/12/25,  MRN: [id number]  No chief complaint on file.   67 y.o. female presents with the above complaint.  Patient presents with left ankle severe arthritis.  Patient states is painful.  She had a history of ankle fracture that led to traumatic arthritis.  She has been to see other physician who has done injections boot immobilization and physical therapy none of which has helped.  She wanted to discuss further treatment options however she also does not want to do back surgery.  Pain scale is 5 out of 10 dull achy in nature.   Review of Systems: Negative except as noted in the HPI. Denies N/V/F/Ch.  Past Medical History:  Diagnosis Date   Abnormal Pap smear of cervix    Hyperlipemia    Hypertension     Current Outpatient Medications:    rosuvastatin (CRESTOR) 40 MG tablet, Take 1 tablet (40 mg total) by mouth daily., Disp: 90 tablet, Rfl: 3   amLODipine-valsartan (EXFORGE) 10-320 MG tablet, Take 1 tablet by mouth daily., Disp: 90 tablet, Rfl: 3   famotidine (PEPCID) 40 MG tablet, Take 1 tablet (40 mg total) by mouth at bedtime., Disp: 90 tablet, Rfl: 3   hydrochlorothiazide (HYDRODIURIL) 25 MG tablet, Take 1 tablet (25 mg total) by mouth daily., Disp: 90 tablet, Rfl: 3   pantoprazole (PROTONIX) 40 MG tablet, Take 1 tablet (40 mg total) by mouth daily., Disp: 90 tablet, Rfl: 3  Social History   Tobacco Use  Smoking Status Never   Passive exposure: Never  Smokeless Tobacco Never    Allergies  Allergen Reactions   Aspirin Shortness Of Breath    Other Reaction(s): Other (See Comments)  Pt states that she feels "desperate"   Objective:  There were no vitals filed for this visit. There is no height or weight on file to calculate BMI. Constitutional Well developed. Well nourished.  Vascular Dorsalis pedis pulses palpable bilaterally. Posterior tibial pulses palpable bilaterally. Capillary refill normal to all  digits.  No cyanosis or clubbing noted. Pedal hair growth normal.  Neurologic Normal speech. Oriented to person, place, and time. Epicritic sensation to light touch grossly present bilaterally.  Dermatologic Nails well groomed and normal in appearance. No open wounds. No skin lesions.  Orthopedic: Limited range of motion noted at the left ankle pain on palpation with the range of motion of the ankle joint deep intra-articular pain noted.  Pain with dorsiflexion of the ankle joint some pain with plantarflexion of the ankle joint.  No pain at the Achilles tendon peroneal tendon posterior tibial tendon and ATFL ligament   Radiographs: 3 views of the toe show mature adult left foot: Moderate to severe arthritis of the ankle joint noted.  Uneven joint space narrowing with partial fusion of the ankle joint. Assessment:   1. Traumatic arthritis of left ankle   2. Arthritis of ankle, left    Plan:  Patient was evaluated and treated and all questions answered.  Left ankle severe arthritis -All questions or concerns were discussed with the patient in extensive detail given the amount of pain that she is having she will benefit from an MRI evaluation to assess the severity of the ankle arthritis.  At this time I discussed with her that she may benefit from ankle arthroscopy but likely will need ankle fusion/replacement.  I discussed this with the patient she states understanding.  She would like to hold off on surgical intervention for  now and manage it -MRI was ordered  No follow-ups on file.

## 2022-11-06 NOTE — Progress Notes (Signed)
   11/06/2022  Patient ID: Dawn Cuevas, female   DOB: Dec 21, 1955, 67 y.o.   MRN: [id number]  Outreach attempt for scheduled telephone visit to review medications was unsuccessful.  Called patient with Spanish interpreters from [employer] on the line x2.  We were able to leave HIPAA compliant voicemails with my direct phone number, but I will also coordinate with scheduler to see if we can get visit rescheduled.    Lenna Gilford, PharmD, DPLA

## 2022-11-08 ENCOUNTER — Telehealth: Payer: Self-pay | Admitting: Family Medicine

## 2022-11-08 ENCOUNTER — Encounter: Payer: Self-pay | Admitting: Family Medicine

## 2022-11-08 ENCOUNTER — Ambulatory Visit (INDEPENDENT_AMBULATORY_CARE_PROVIDER_SITE_OTHER): Payer: Medicare (Managed Care) | Admitting: Family Medicine

## 2022-11-08 VITALS — BP 125/75 | HR 76 | Temp 97.2°F | Wt 191.0 lb

## 2022-11-08 DIAGNOSIS — I10 Essential (primary) hypertension: Secondary | ICD-10-CM | POA: Diagnosis not present

## 2022-11-08 DIAGNOSIS — K219 Gastro-esophageal reflux disease without esophagitis: Secondary | ICD-10-CM

## 2022-11-08 DIAGNOSIS — M12572 Traumatic arthropathy, left ankle and foot: Secondary | ICD-10-CM | POA: Diagnosis not present

## 2022-11-08 DIAGNOSIS — R053 Chronic cough: Secondary | ICD-10-CM

## 2022-11-08 MED ORDER — HYDROCHLOROTHIAZIDE 25 MG PO TABS
25.0000 mg | ORAL_TABLET | Freq: Every day | ORAL | 3 refills | Status: DC
Start: 2022-11-08 — End: 2023-07-17

## 2022-11-08 MED ORDER — PANTOPRAZOLE SODIUM 40 MG PO TBEC
40.0000 mg | DELAYED_RELEASE_TABLET | Freq: Every day | ORAL | 3 refills | Status: DC
Start: 2022-11-08 — End: 2023-10-15

## 2022-11-08 MED ORDER — FAMOTIDINE 40 MG PO TABS
40.0000 mg | ORAL_TABLET | Freq: Every day | ORAL | 3 refills | Status: DC
Start: 2022-11-08 — End: 2024-02-05

## 2022-11-08 MED ORDER — AMLODIPINE BESYLATE-VALSARTAN 10-320 MG PO TABS
1.0000 | ORAL_TABLET | Freq: Every day | ORAL | 3 refills | Status: DC
Start: 2022-11-08 — End: 2024-01-08

## 2022-11-08 NOTE — Assessment & Plan Note (Signed)
Symptoms improved on prep gel, however unable to afford.    Plan: Switch to pantoprazole 40 mg daily due to cost efficiency concerns with rabeprazole. Add famotidine 40 mg once at night to assist with symptoms. Refer the patient to a gastroenterologist for further evaluation due to persistent symptoms. Continue with lifestyle modifications including diet adjustments.

## 2022-11-08 NOTE — Telephone Encounter (Signed)
Reached out to patient with interpreter Madilynn [phone] and went over medication changes from visit today and she verbalized understanding

## 2022-11-08 NOTE — Assessment & Plan Note (Signed)
Home blood pressure stable  Plan: Continue current medication regimen: amlodipine 10 mg, valsartan 320 mg, hydrochlorothiazide 25 mg. Monitor blood pressure at home; record daily. Encourage patient continue lifestyle modifications

## 2022-11-08 NOTE — Progress Notes (Signed)
Assessment/Plan:   Problem List Items Addressed This Visit       Cardiovascular and Mediastinum   Essential hypertension    Home blood pressure stable  Plan: Continue current medication regimen: amlodipine 10 mg, valsartan 320 mg, hydrochlorothiazide 25 mg. Monitor blood pressure at home; record daily. Encourage patient continue lifestyle modifications       Relevant Medications   amLODipine-valsartan (EXFORGE) 10-320 MG tablet   hydrochlorothiazide (HYDRODIURIL) 25 MG tablet     Digestive   GERD without esophagitis - Primary    Symptoms improved on prep gel, however unable to afford.    Plan: Switch to pantoprazole 40 mg daily due to cost efficiency concerns with rabeprazole. Add famotidine 40 mg once at night to assist with symptoms. Refer the patient to a gastroenterologist for further evaluation due to persistent symptoms. Continue with lifestyle modifications including diet adjustments.      Relevant Medications   pantoprazole (PROTONIX) 40 MG tablet   famotidine (PEPCID) 40 MG tablet   Other Relevant Orders   Ambulatory referral to Gastroenterology     Musculoskeletal and Integument   Traumatic arthritis of left ankle     Other   Chronic cough    Medications Discontinued During This Encounter  Medication Reason   RABEprazole (ACIPHEX) 20 MG tablet    hydrochlorothiazide (HYDRODIURIL) 25 MG tablet Reorder   amLODipine-valsartan (EXFORGE) 10-320 MG tablet Reorder    Return in about 6 months (around 05/11/2023) for BP, fasting labs.    Subjective:   Encounter date: 11/08/2022  Dawn Cuevas is a 67 y.o. female who has Uses Spanish as primary spoken language; Traumatic arthritis of left ankle; Primary osteoarthritis of right knee; Prediabetes; Osteochondroma of right tibia; Hyperlipidemia; GERD without esophagitis; Essential hypertension; Genitourinary syndrome of menopause; Class 2 severe obesity with serious comorbidity in adult [facility]); CKD  (chronic kidney disease) stage 2, GFR 60-89 ml/min; and Chronic cough on their problem list..   She  has a past medical history of Abnormal Pap smear of cervix, Hyperlipemia, and Hypertension..   Chief Complaint: Follow-up on blood pressure management and gastrointestinal symptoms.  History of Present Illness:  1. Blood Pressure. The patient reports that she has started walking and changed her diet to manage her blood pressure. She mentions that she monitors her blood pressure at home, and it generally stays within a reasonable range, averaging around 125/75 over the past two weeks.  The patient is currently on hydrochlorothiazide and Exforge.  2. Acid Reflux.  Ongoing feelings of reflux and bloated symptoms.  The patient had been on medications such as rabeprazole and esomeprazole in the past, which were helpful but have lost effectiveness over time. The patient has been prescribed pantoprazole 40 mg in the cost and asked that this be represcribed due to cost-related issues with prescribed as rabeprazole is cost prohibitive.  3. Cough. The patient has experienced intermittent coughing, which was previously evaluated and thought to be related to a virus or flu but later attributed to her acid reflux. The cough seemed to improve with acid reflux medication.  Review of Systems  Constitutional:  Negative for chills, diaphoresis, fever, malaise/fatigue and weight loss.  HENT:  Negative for congestion, ear discharge, ear pain and hearing loss.   Eyes:  Negative for blurred vision, double vision, photophobia, pain, discharge and redness.  Respiratory:  Positive for cough. Negative for sputum production, shortness of breath and wheezing.   Cardiovascular:  Negative for chest pain and palpitations.  Gastrointestinal:  Positive  for heartburn. Negative for abdominal pain, blood in stool, constipation, diarrhea, melena, nausea and vomiting.  Genitourinary:  Negative for dysuria, flank pain, frequency,  hematuria and urgency.  Musculoskeletal:  Negative for myalgias.  Skin:  Negative for itching and rash.  Neurological:  Negative for dizziness, tingling, tremors, speech change, seizures, loss of consciousness, weakness and headaches.  Psychiatric/Behavioral:  Negative for depression, hallucinations, memory loss, substance abuse and suicidal ideas. The patient does not have insomnia.   All other systems reviewed and are negative.   Past Surgical History:  Procedure Laterality Date   L ankle external fixation Left 2016   Pt report through interpreter    Outpatient Medications Prior to Visit  Medication Sig Dispense Refill   rosuvastatin (CRESTOR) 40 MG tablet Take 1 tablet (40 mg total) by mouth daily. 90 tablet 3   amLODipine-valsartan (EXFORGE) 10-320 MG tablet Take 1 tablet by mouth daily.     hydrochlorothiazide (HYDRODIURIL) 25 MG tablet Take 25 mg by mouth daily.     RABEprazole (ACIPHEX) 20 MG tablet Take 1 tablet (20 mg total) by mouth daily. (Patient not taking: Reported on 11/08/2022) 90 tablet 0   No facility-administered medications prior to visit.    Family History  Problem Relation Age of Onset   Heart disease Mother    Diabetes Father    Hypertension Father     Social History   Socioeconomic History   Marital status: Married    Spouse name: Not on file   Number of children: Not on file   Years of education: Not on file   Highest education level: Not on file  Occupational History   Not on file  Tobacco Use   Smoking status: Never    Passive exposure: Never   Smokeless tobacco: Never  Vaping Use   Vaping status: Never Used  Substance and Sexual Activity   Alcohol use: No   Drug use: No   Sexual activity: Yes    Birth control/protection: None  Other Topics Concern   Not on file  Social History Narrative   Not on file   Social Determinants of Health   Financial Resource Strain: Not on file  Food Insecurity: Food Insecurity Present (01/28/2022)    Received from [facility], [facility]   Hunger Vital Sign    Worried About Running Out of Food in the Last Year: Sometimes true    Ran Out of Food in the Last Year: Sometimes true  Transportation Needs: Not on file  Physical Activity: Not on file  Stress: Not on file  Social Connections: Unknown (08/06/2021)   Received from [facility], [facility]   Social Network    Social Network: Not on file  Intimate Partner Violence: Unknown (06/28/2021)   Received from [facility], [facility]   HITS    Physically Hurt: Not on file    Insult or Talk Down To: Not on file    Threaten Physical Harm: Not on file    Scream or Curse: Not on file  Objective:  Physical Exam: BP 125/75   Pulse 76   Temp (!) 97.2 F (36.2 C) (Temporal)   Wt 191 lb (86.6 kg)   SpO2 99%   BMI 34.38 kg/m   Wt Readings from Last 3 Encounters:  11/08/22 191 lb (86.6 kg)  09/30/22 191 lb 3.2 oz (86.7 kg)  08/07/22 195 lb 6.4 oz (88.6 kg)    BP Readings from Last 3 Encounters:  11/08/22 125/75  09/30/22 (!) 142/84  08/07/22 (!) 164/92     Physical Exam Constitutional:      General: She is not in acute distress.    Appearance: Normal appearance. She is not ill-appearing or toxic-appearing.  HENT:     Head: Normocephalic and atraumatic.     Nose: Nose normal. No congestion.  Eyes:     General: No scleral icterus.    Extraocular Movements: Extraocular movements intact.  Cardiovascular:     Rate and Rhythm: Normal rate and regular rhythm.     Pulses: Normal pulses.     Heart sounds: Normal heart sounds.  Pulmonary:     Effort: Pulmonary effort is normal. No respiratory distress.     Breath sounds: Normal breath sounds.  Abdominal:     General: Abdomen is flat. Bowel sounds are normal.     Palpations: Abdomen is soft.  Musculoskeletal:        General: Normal range of motion.  Lymphadenopathy:      Cervical: No cervical adenopathy.  Skin:    General: Skin is warm and dry.     Findings: No rash.  Neurological:     General: No focal deficit present.     Mental Status: She is alert and oriented to person, place, and time. Mental status is at baseline.  Psychiatric:        Mood and Affect: Mood normal.        Behavior: Behavior normal.        Thought Content: Thought content normal.        Judgment: Judgment normal.     DG Ankle Complete Left  Result Date: 11/06/2022 Please see detailed radiograph report in office note.  MM 3D SCREENING MAMMOGRAM BILATERAL BREAST  Result Date: 09/20/2022 CLINICAL DATA:  Screening. EXAM: DIGITAL SCREENING BILATERAL MAMMOGRAM WITH TOMOSYNTHESIS AND CAD TECHNIQUE: Bilateral screening digital craniocaudal and mediolateral oblique mammograms were obtained. Bilateral screening digital breast tomosynthesis was performed. The images were evaluated with computer-aided detection. COMPARISON:  Previous exam(s). ACR Breast Density Category c: The breasts are heterogeneously dense, which may obscure small masses. FINDINGS: In the right breast a possible asymmetry requires further evaluation. In the left breast a possible asymmetry requires further evaluation. IMPRESSION: Further evaluation is suggested for possible asymmetry in the right breast. Further evaluation is suggested for possible asymmetry in the left breast. RECOMMENDATION: Diagnostic mammogram and possibly ultrasound of both breasts. (Code:FI-B-21M) The patient will be contacted regarding the findings, and additional imaging will be scheduled. BI-RADS CATEGORY  0: Incomplete: Need additional imaging evaluation. Electronically Signed   By: Emmaline Kluver M.D.   On: 09/20/2022 12:45    No results found for this or any previous visit (from the past 2160 hour(s)).      Garner Nash, MD, MS

## 2022-11-08 NOTE — Telephone Encounter (Signed)
Pt would like for you to give her a call . Pt stated there was a mix up on the medications . Please give a call to get the medication correct. Blood pressure med and one other one

## 2022-11-08 NOTE — Patient Instructions (Addendum)
Deje de tomar rabeprazol. Tome 40 mg de pantoprazol una vez al da para [employer]. Comience a tomar famotidina 40 mg por la noche segn lo prescrito. Contine caminando diariamente y manteniendo una dieta saludable para ayudar a Scientist, physiological presin arterial. Controle la presin arterial en casa con regularidad y lleve un registro de las lecturas. Contine con los medicamentos actuales para la presin arterial.  Programe una cita de seguimiento con Merchant navy officer y vuelva a Chief Operating Officer los laboratorios en seis meses.  Vaya a la farmacia para recibir las vacunas contra la [place], el COVID, la gripe, el ttanos y el VRS.  Stop taking rabeprazole. Take pantoprazole 40 mg once daily to manage stomach issues. Start taking famotidine 40 mg at night as prescribed. Continue walking daily and maintaining a healthy diet to help control blood pressure. Monitor blood pressure at home regularly and keep a log of the readings. Continue current blood pressure medications.  Schedule a follow-up appointment with the gastroenterologist, and recheck labs in six months. Please go to phramacy to get shingles, COVID, Flu, Tetanus, and RSV vaccines.

## 2022-11-14 ENCOUNTER — Ambulatory Visit: Payer: Medicare (Managed Care)

## 2022-11-14 ENCOUNTER — Other Ambulatory Visit: Payer: Self-pay | Admitting: Family Medicine

## 2022-11-14 ENCOUNTER — Ambulatory Visit
Admission: RE | Admit: 2022-11-14 | Discharge: 2022-11-14 | Disposition: A | Discharge status: Home / Self Care | Payer: Medicare Other | Source: Ambulatory Visit | Attending: Family Medicine | Admitting: Family Medicine

## 2022-11-14 ENCOUNTER — Ambulatory Visit
Admission: RE | Admit: 2022-11-14 | Discharge: 2022-11-14 | Disposition: A | Discharge status: Home / Self Care | Payer: Medicare (Managed Care) | Source: Ambulatory Visit | Attending: Family Medicine | Admitting: Family Medicine

## 2022-11-14 DIAGNOSIS — R928 Other abnormal and inconclusive findings on diagnostic imaging of breast: Secondary | ICD-10-CM | POA: Diagnosis not present

## 2022-11-14 DIAGNOSIS — N6012 Diffuse cystic mastopathy of left breast: Secondary | ICD-10-CM | POA: Diagnosis not present

## 2022-11-26 ENCOUNTER — Telehealth: Payer: Self-pay

## 2022-11-26 NOTE — Progress Notes (Signed)
   Care Guide Note  11/26/2022 Name: Dawn Cuevas MRN: [id number] DOB: 1955/09/22  Referred by: Garnette Gunner, MD Reason for referral : Care Coordination (Outreach to reschedule with Pharm d )   Dawn Cuevas is a 67 y.o. year old female who is a primary care patient of Garnette Gunner, MD. Dawn Cuevas was referred to the pharmacist for assistance related to HLD.    An unsuccessful telephone outreach was attempted today to contact the patient who was referred to the pharmacy team for assistance with medication management. Additional attempts will be made to contact the patient.   Penne Lash, RMA Care Guide [facility]  [place], [place] [address] Direct Dial: [phone] [email or web address]

## 2022-11-28 NOTE — Progress Notes (Signed)
   Care Guide Note  11/28/2022 Name: Dawn Cuevas MRN: [id number] DOB: 12-Jun-1955  Referred by: Garnette Gunner, MD Reason for referral : Care Coordination (Outreach to reschedule with Pharm d )   Dawn Cuevas is a 67 y.o. year old female who is a primary care patient of Garnette Gunner, MD. Dawn Cuevas was referred to the pharmacist for assistance related to HLD.    A second unsuccessful telephone outreach was attempted today to contact the patient who was referred to the pharmacy team for assistance with medication management. Additional attempts will be made to contact the patient.  Penne Lash, RMA Care Guide [facility]  [place], [place] [address] Direct Dial: [phone] [email or web address]

## 2022-12-06 ENCOUNTER — Telehealth: Payer: Self-pay | Admitting: Gastroenterology

## 2022-12-06 NOTE — Telephone Encounter (Signed)
Hi Dr. Meridee Score,  We received a referral for patient to be evaluated for [facility]. Patients daughter called seeking a transfer of care due to the patient having new insurance and would need to be within the [facility]. Patients records were obtained and scanned into Media for you to review and advise.  Thank you

## 2022-12-08 NOTE — Telephone Encounter (Signed)
Since insurance is an issue, she can be accepted into the [facility] to continue GI care. Patient can be scheduled for a new patient visit with me or any APP and can be staffed by supervising MD. Dawn Cuevas

## 2022-12-09 ENCOUNTER — Ambulatory Visit
Admission: RE | Admit: 2022-12-09 | Discharge: 2022-12-09 | Disposition: A | Discharge status: Home / Self Care | Payer: Medicare Other | Source: Ambulatory Visit | Attending: Podiatry | Admitting: Podiatry

## 2022-12-09 DIAGNOSIS — M12572 Traumatic arthropathy, left ankle and foot: Secondary | ICD-10-CM

## 2022-12-09 DIAGNOSIS — M19072 Primary osteoarthritis, left ankle and foot: Secondary | ICD-10-CM | POA: Diagnosis not present

## 2022-12-09 DIAGNOSIS — M25572 Pain in left ankle and joints of left foot: Secondary | ICD-10-CM | POA: Diagnosis not present

## 2022-12-09 DIAGNOSIS — M7662 Achilles tendinitis, left leg: Secondary | ICD-10-CM | POA: Diagnosis not present

## 2022-12-09 NOTE — Progress Notes (Signed)
Care Coordination Note  12/09/2022 Name: Eliora Sveen MRN: [id number] DOB: 03-21-1956  Dawn Cuevas is a 67 y.o. year old female who is a primary care patient of Garnette Gunner, MD and is actively engaged with the Chronic Care Management team. I reached out to Jarome Matin by phone today to assist with re-scheduling an initial visit with the Pharmacist  Follow up plan: Unable to make contact on outreach attempts x 3. PCP Garnette Gunner, MD notified via routed documentation in medical record.   Penne Lash, RMA Care Guide [facility]  [place], [place] [address] Direct Dial: [phone] [email or web address]

## 2022-12-10 ENCOUNTER — Encounter: Payer: Self-pay | Admitting: Gastroenterology

## 2022-12-13 ENCOUNTER — Telehealth: Payer: Self-pay | Admitting: Family Medicine

## 2022-12-13 NOTE — Telephone Encounter (Signed)
Done. Referral fax per request

## 2022-12-13 NOTE — Telephone Encounter (Signed)
Pts daughter called to request that her referral be changed due to it is not covered by insurance. Referred To  [facility]    Be sent to [facility] in [facility]  P# [phone] Fax# [phone]

## 2022-12-25 ENCOUNTER — Ambulatory Visit: Payer: Medicare (Managed Care) | Admitting: Podiatry

## 2022-12-25 DIAGNOSIS — M12572 Traumatic arthropathy, left ankle and foot: Secondary | ICD-10-CM

## 2022-12-25 DIAGNOSIS — M19072 Primary osteoarthritis, left ankle and foot: Secondary | ICD-10-CM | POA: Diagnosis not present

## 2022-12-25 NOTE — Progress Notes (Signed)
Subjective:  Patient ID: Dawn Cuevas, female    DOB: June 25, 1955,  MRN: [id number]  Chief Complaint  Patient presents with   Foot Pain    Mri results     67 y.o. female presents with the above complaint.  Patient presents with left ankle severe arthritis.  Patient states is painful.  She had a history of ankle fracture that led to traumatic arthritis.  She has been to see other physician who has done injections boot immobilization and physical therapy none of which has helped.  She wanted to discuss further treatment options however she also does not want to do back surgery.  Pain scale is 5 out of 10 dull achy in nature.   Review of Systems: Negative except as noted in the HPI. Denies N/V/F/Ch.  Past Medical History:  Diagnosis Date   Abnormal Pap smear of cervix    Hyperlipemia    Hypertension     Current Outpatient Medications:    amLODipine-valsartan (EXFORGE) 10-320 MG tablet, Take 1 tablet by mouth daily., Disp: 90 tablet, Rfl: 3   famotidine (PEPCID) 40 MG tablet, Take 1 tablet (40 mg total) by mouth at bedtime., Disp: 90 tablet, Rfl: 3   hydrochlorothiazide (HYDRODIURIL) 25 MG tablet, Take 1 tablet (25 mg total) by mouth daily., Disp: 90 tablet, Rfl: 3   pantoprazole (PROTONIX) 40 MG tablet, Take 1 tablet (40 mg total) by mouth daily., Disp: 90 tablet, Rfl: 3   rosuvastatin (CRESTOR) 40 MG tablet, Take 1 tablet (40 mg total) by mouth daily., Disp: 90 tablet, Rfl: 3  Social History   Tobacco Use  Smoking Status Never   Passive exposure: Never  Smokeless Tobacco Never    Allergies  Allergen Reactions   Aspirin Shortness Of Breath    Other Reaction(s): Other (See Comments)  Pt states that she feels "desperate"   Objective:  There were no vitals filed for this visit. There is no height or weight on file to calculate BMI. Constitutional Well developed. Well nourished.  Vascular Dorsalis pedis pulses palpable bilaterally. Posterior tibial pulses palpable  bilaterally. Capillary refill normal to all digits.  No cyanosis or clubbing noted. Pedal hair growth normal.  Neurologic Normal speech. Oriented to person, place, and time. Epicritic sensation to light touch grossly present bilaterally.  Dermatologic Nails well groomed and normal in appearance. No open wounds. No skin lesions.  Orthopedic: Limited range of motion noted at the left ankle pain on palpation with the range of motion of the ankle joint deep intra-articular pain noted.  Pain with dorsiflexion of the ankle joint some pain with plantarflexion of the ankle joint.  No pain at the Achilles tendon peroneal tendon posterior tibial tendon and ATFL ligament   Radiographs: 3 views of the toe show mature adult left foot: Moderate to severe arthritis of the ankle joint noted.  Uneven joint space narrowing with partial fusion of the ankle joint.  IMPRESSION: 1. Severe osteoarthritis of the tibiotalar joint. 2. Mild tendinosis of the Achilles tendon without a tear. 3. Mild osteoarthritis of the talonavicular joint. 4. Mild osteoarthritis of the navicular-medial cuneiform joint. 5. Mild osteoarthritis of the calcaneocuboid joint. 6. Mild osteoarthritis of the second TMT joint. Assessment:   1. Traumatic arthritis of left ankle   2. Arthritis of ankle, left     Plan:  Patient was evaluated and treated and all questions answered.  Left ankle severe arthritis -All questions or concerns were discussed with the patient in extensive detail MRI was reviewed with  the patient in extensive detail which shows severe osteoarthritis of the ankle joint.  The subtalar joint appears to be within normal limits however given the nature of the ankle joint the patient in the future will benefit TTC fusion given that patient had prior ankle fracture that led to this arthritis.  I discussed this briefly with the patient she states understanding -At this time patient wanted to focus more of conservative/less  invasive approach.  I discussed PRP injection versus arthroscopy.  I discussed with her that they could be beneficial however given the amount of arthritis I am not confident that it would be very helpful.  They state understand will think about the procedure and will get back to me when she is ready  No follow-ups on file.

## 2023-01-01 IMAGING — DX DG ANKLE COMPLETE 3+V*L*
3 series · 3 of 3 positions shown · non-contrast
Comparison: None.

CLINICAL DATA: Left ankle pain.

EXAM:
LEFT ANKLE COMPLETE - 3+ VIEW

[ankle ap]
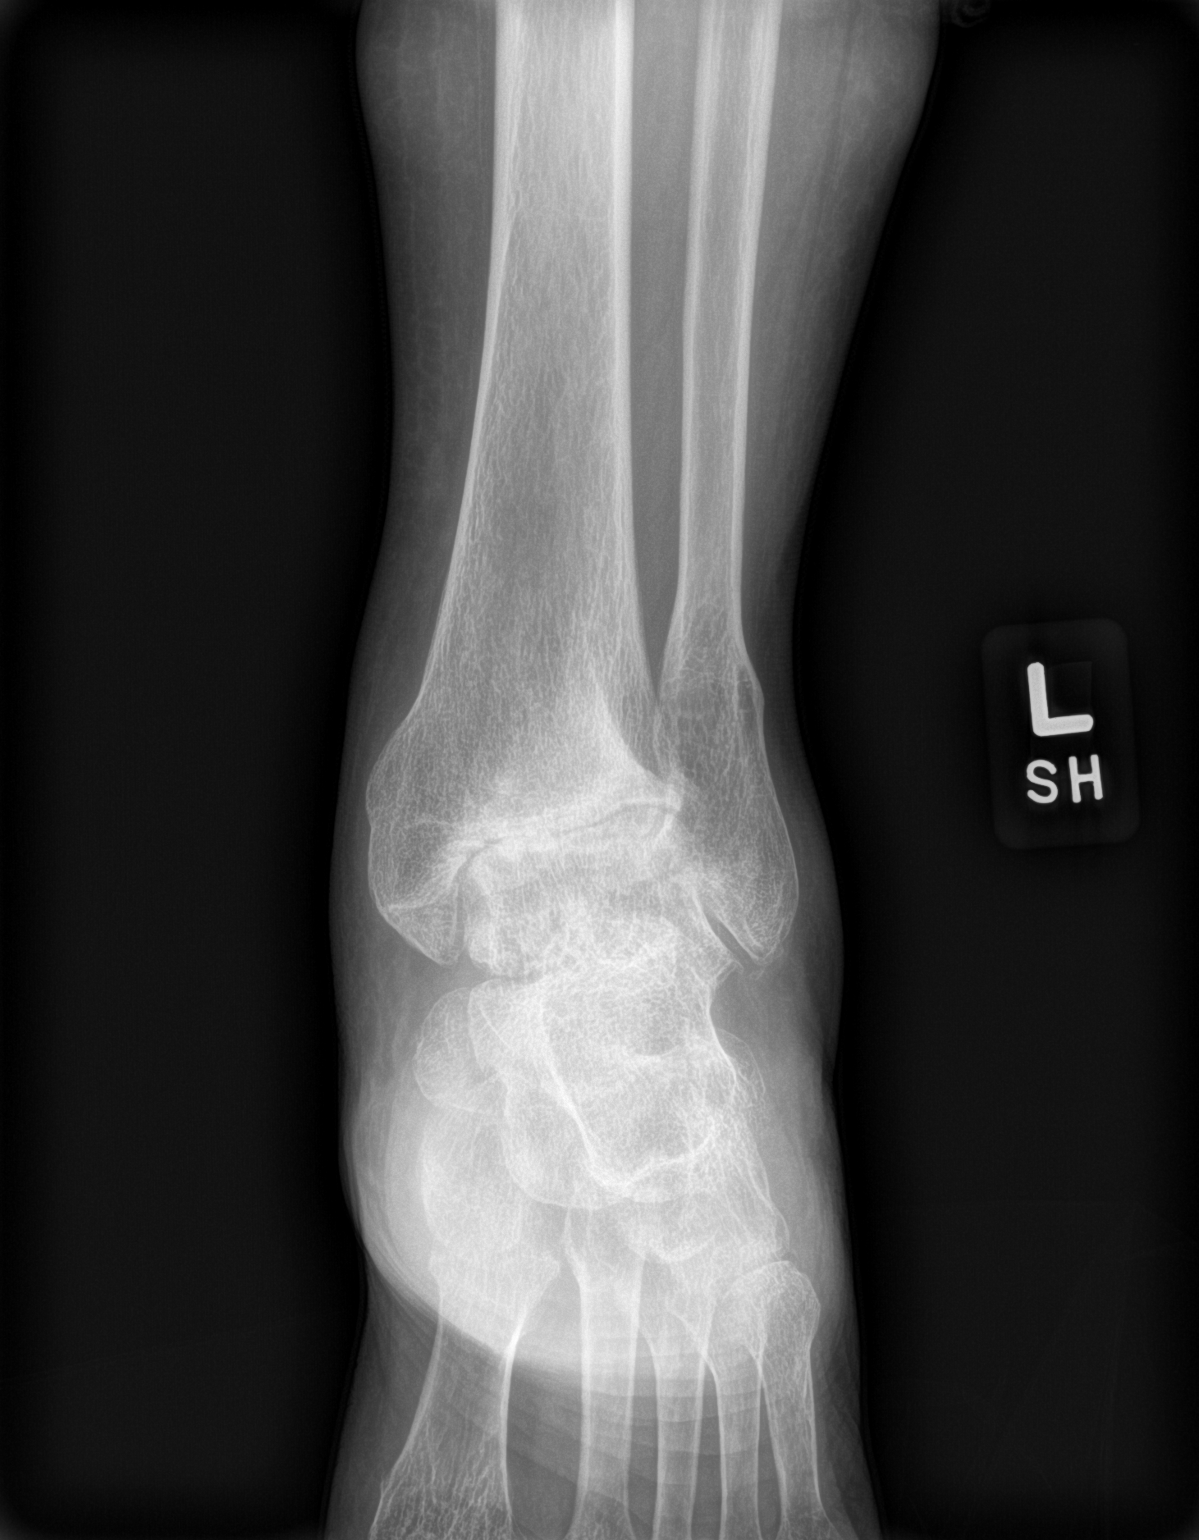

[ankle obl]
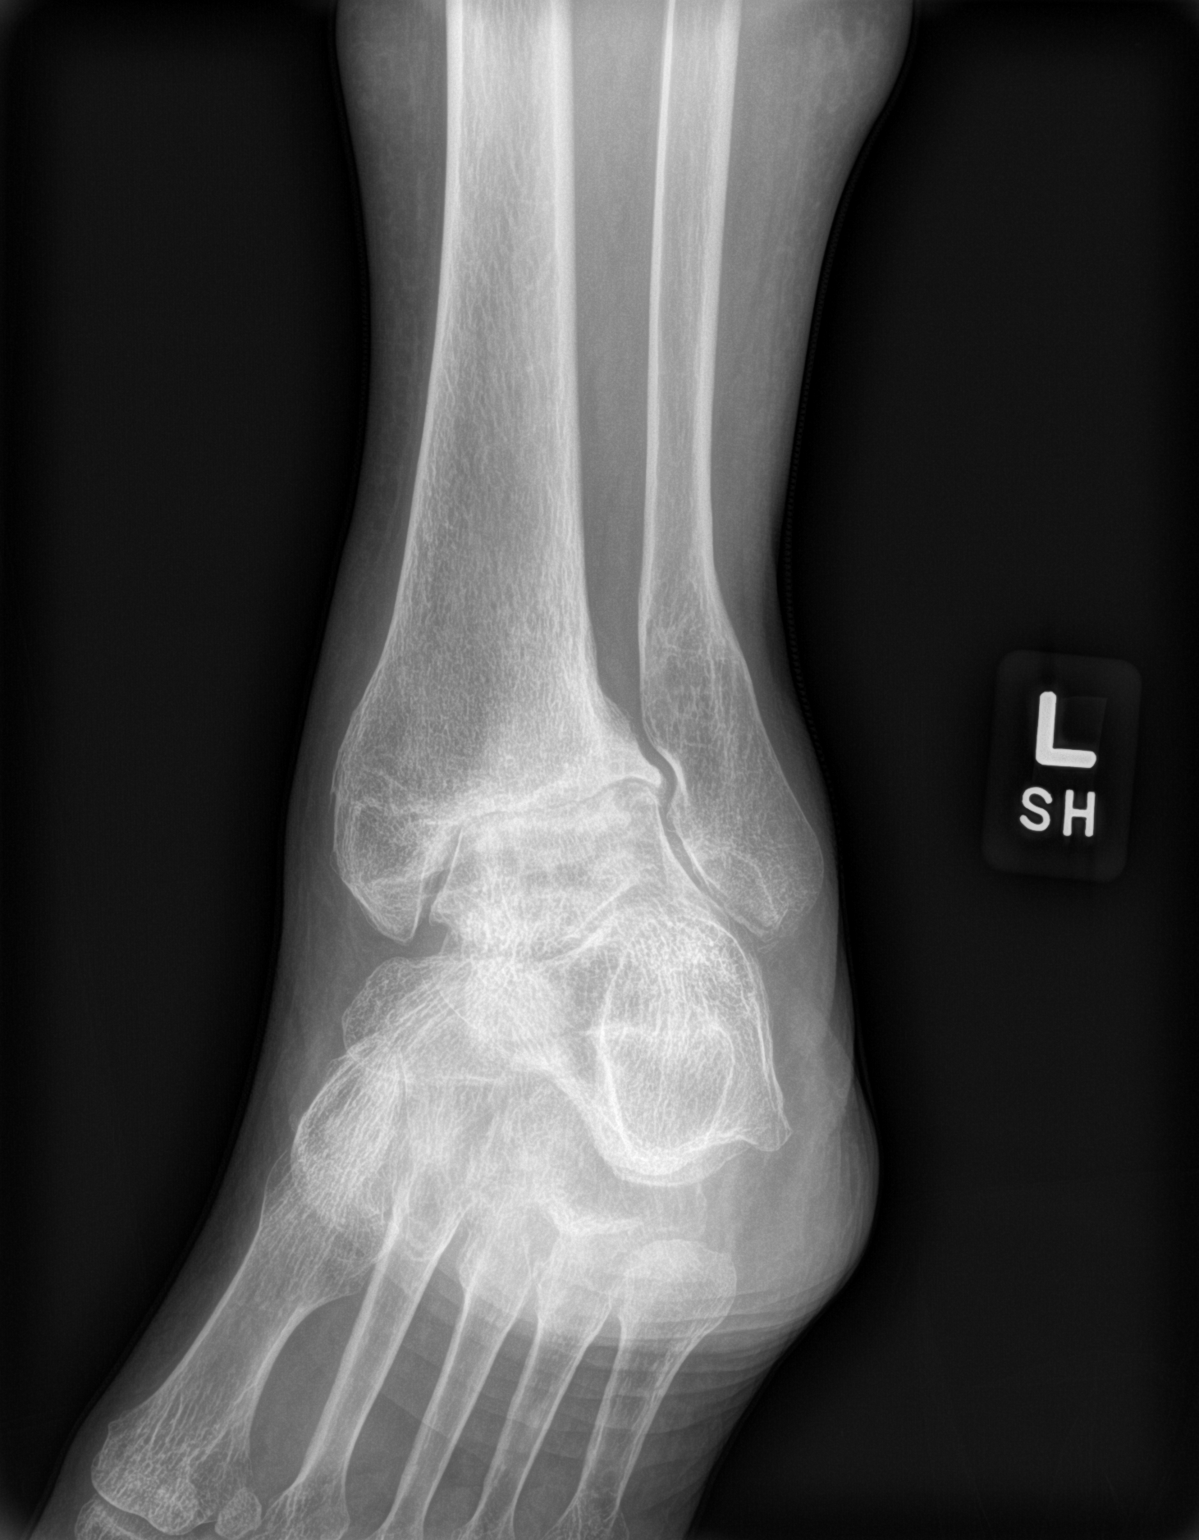

[ankle lat]
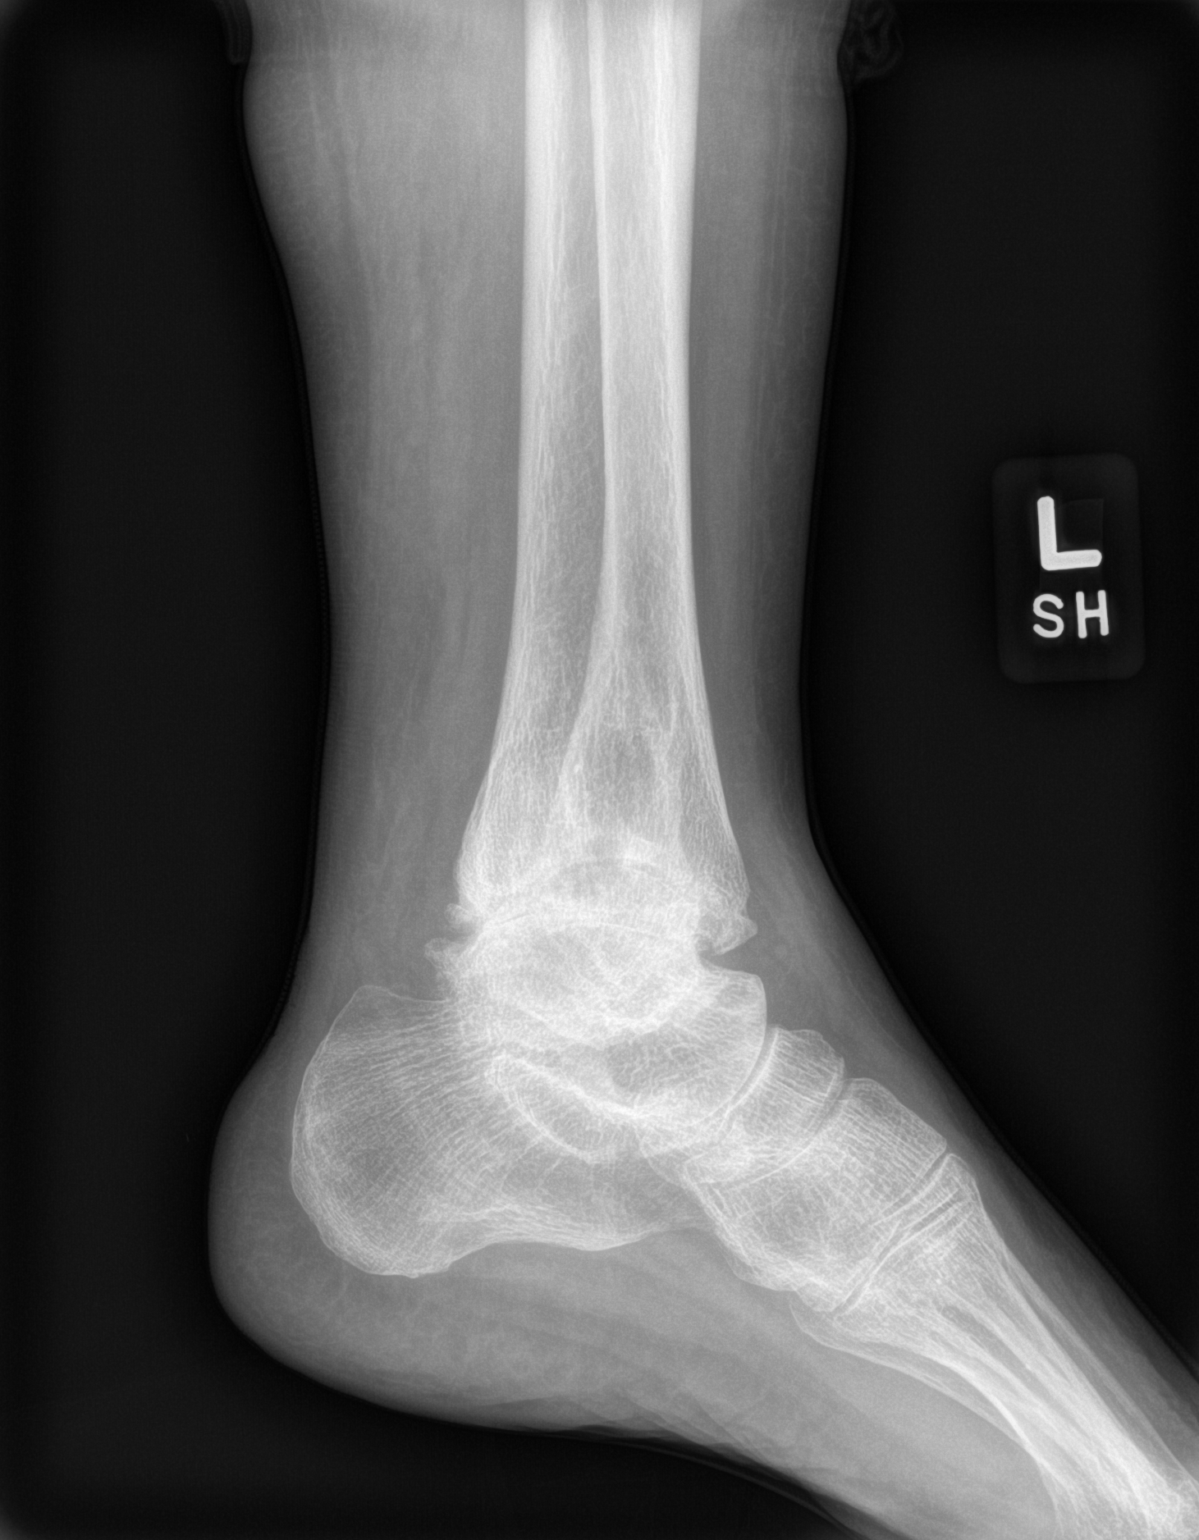

[3 of 3 positions shown; findings below may reference images not displayed]

FINDINGS: There is diffuse soft tissue swelling surrounding the ankle. There
is no acute fracture or dislocation identified. There is severe
talotibial joint space narrowing with bone-on-bone configuration and
sclerosis. Small osteophyte formation present.
IMPRESSION: 1. Severe degenerative changes of the talotibial joint.
2. No acute fracture.
3. Soft tissue swelling.

## 2023-02-07 DIAGNOSIS — Z8719 Personal history of other diseases of the digestive system: Secondary | ICD-10-CM | POA: Diagnosis not present

## 2023-02-07 DIAGNOSIS — K219 Gastro-esophageal reflux disease without esophagitis: Secondary | ICD-10-CM | POA: Diagnosis not present

## 2023-02-07 DIAGNOSIS — K469 Unspecified abdominal hernia without obstruction or gangrene: Secondary | ICD-10-CM | POA: Diagnosis not present

## 2023-03-12 ENCOUNTER — Ambulatory Visit: Payer: Medicare (Managed Care) | Admitting: Gastroenterology

## 2023-05-02 ENCOUNTER — Other Ambulatory Visit: Payer: Medicare (Managed Care)

## 2023-05-09 ENCOUNTER — Ambulatory Visit: Payer: Medicare (Managed Care) | Admitting: Family Medicine

## 2023-06-16 ENCOUNTER — Telehealth: Payer: Self-pay

## 2023-06-16 NOTE — Telephone Encounter (Signed)
 Spoke with patient regarding a referral that was sent to [place] from [facility]. Patient stated that she no longer has menopause symptoms and doesn't need an appointment.

## 2023-07-03 ENCOUNTER — Ambulatory Visit: Payer: Medicare (Managed Care) | Admitting: Family Medicine

## 2023-07-17 ENCOUNTER — Other Ambulatory Visit: Payer: Self-pay | Admitting: Family Medicine

## 2023-07-17 DIAGNOSIS — I10 Essential (primary) hypertension: Secondary | ICD-10-CM

## 2023-07-17 DIAGNOSIS — E785 Hyperlipidemia, unspecified: Secondary | ICD-10-CM

## 2023-07-17 NOTE — Telephone Encounter (Signed)
Patient needs an appt for future refills

## 2023-07-21 ENCOUNTER — Telehealth: Payer: Self-pay | Admitting: Family Medicine

## 2023-07-21 NOTE — Telephone Encounter (Signed)
 Patient was changed to a New Patient and has an interpreter.

## 2023-07-21 NOTE — Telephone Encounter (Signed)
 Pt needs a Spanish interpreter for her TOC appt tomorrow.  Copied from CRM [id number]. Topic: General - Other >> Jul 21, 2023  3:38 PM Marissa P wrote: Reason for CRM: Patient just wants to be sure they are made aware she needs a spanish interpreter for tomorrow.

## 2023-07-22 ENCOUNTER — Encounter: Payer: Self-pay | Admitting: Family Medicine

## 2023-07-22 ENCOUNTER — Ambulatory Visit (INDEPENDENT_AMBULATORY_CARE_PROVIDER_SITE_OTHER): Admitting: Family Medicine

## 2023-07-22 VITALS — BP 157/79 | HR 83 | Temp 98.2°F | Resp 18 | Ht 61.0 in | Wt 190.2 lb

## 2023-07-22 DIAGNOSIS — I10 Essential (primary) hypertension: Secondary | ICD-10-CM

## 2023-07-22 DIAGNOSIS — R300 Dysuria: Secondary | ICD-10-CM | POA: Diagnosis not present

## 2023-07-22 DIAGNOSIS — Z7689 Persons encountering health services in other specified circumstances: Secondary | ICD-10-CM | POA: Diagnosis not present

## 2023-07-22 DIAGNOSIS — R1032 Left lower quadrant pain: Secondary | ICD-10-CM

## 2023-07-22 DIAGNOSIS — K640 First degree hemorrhoids: Secondary | ICD-10-CM | POA: Diagnosis not present

## 2023-07-22 LAB — POCT URINALYSIS DIP (CLINITEK)
Bilirubin, UA: NEGATIVE
Blood, UA: NEGATIVE
Glucose, UA: NEGATIVE mg/dL
Ketones, POC UA: NEGATIVE mg/dL
Leukocytes, UA: NEGATIVE
Nitrite, UA: NEGATIVE
POC PROTEIN,UA: NEGATIVE
Spec Grav, UA: 1.01 (ref 1.010–1.025)
Urobilinogen, UA: 0.2 U/dL
pH, UA: 5 (ref 5.0–8.0)

## 2023-07-22 MED ORDER — CIPROFLOXACIN HCL 250 MG PO TABS: 250.0000 mg | ORAL_TABLET | Freq: Two times a day (BID) | ORAL | 0 refills | Status: AC

## 2023-07-22 MED ORDER — HYDROCORTISONE (PERIANAL) 2.5 % EX CREA: 1.0000 | TOPICAL_CREAM | Freq: Two times a day (BID) | CUTANEOUS | 0 refills | Status: DC

## 2023-07-22 NOTE — Progress Notes (Signed)
 New Patient Office Visit  Subjective    Patient ID: Dawn Cuevas, female    DOB: 09-29-1955  Age: 68 y.o. MRN: [id number]  CC:  Chief Complaint  Patient presents with   Establish Care    Multiple complaints     HPI Dawn Cuevas presents to establish care. Pt is new to me with multiple complaints today. Charlsie Cool interpreter. Pt is poor historian.   Dysuria She reports she's had burning with urination for the last 2 months. She reports she's experienced UTIs in the past but it's been 7 years. She reports she has dysuria everyday. She does report coughing and sneezing sometimes causes leakage of urine. She reports urinary frequency at night. She also reports urinary urgency.   Pt reports she was diagnosed with hernia many years ago. She was also diagnosed with esophagitis. She then jumps and states she has pressure in her anus. She feels this when she doesn't have the restroom. She says she feels she has hemorrhoids. She does have pain with wiping but no blood.  She has constipation as well and is difficult to use the restroom.  Outpatient Encounter Medications as of 07/22/2023  Medication Sig   amLODipine -valsartan  (EXFORGE ) 10-320 MG tablet Take 1 tablet by mouth daily.   ciprofloxacin (CIPRO) 250 MG tablet Take 1 tablet (250 mg total) by mouth 2 (two) times daily for 3 days.   famotidine  (PEPCID ) 40 MG tablet Take 1 tablet (40 mg total) by mouth at bedtime.   hydrochlorothiazide  (HYDRODIURIL ) 25 MG tablet TAKE ONE TABLET BY MOUTH DAILY AT 12 NOON con su amlodipine /valsartan    pantoprazole  (PROTONIX ) 40 MG tablet Take 1 tablet (40 mg total) by mouth daily.   rosuvastatin  (CRESTOR ) 40 MG tablet TAKE ONE TABLET BY MOUTH DAILY   No facility-administered encounter medications on file as of 07/22/2023.    Past Medical History:  Diagnosis Date   Abnormal Pap smear of cervix    Hyperlipemia    Hypertension     Past Surgical History:  Procedure Laterality Date    L ankle external fixation Left 2016   Pt report through interpreter    Family History  Problem Relation Age of Onset   Heart disease Mother    Diabetes Father    Hypertension Father     Social History   Socioeconomic History   Marital status: Married    Spouse name: Not on file   Number of children: 5   Years of education: Not on file   Highest education level: Not on file  Occupational History   Not on file  Tobacco Use   Smoking status: Never    Passive exposure: Never   Smokeless tobacco: Never  Vaping Use   Vaping status: Never Used  Substance and Sexual Activity   Alcohol use: No   Drug use: No   Sexual activity: Yes    Birth control/protection: None  Other Topics Concern   Not on file  Social History Narrative   Not on file   Social Drivers of Health   Financial Resource Strain: Not on file  Food Insecurity: Food Insecurity Present (07/22/2023)   Hunger Vital Sign    Worried About Running Out of Food in the Last Year: Sometimes true    Ran Out of Food in the Last Year: Sometimes true  Transportation Needs: No Transportation Needs (07/22/2023)   PRAPARE - Administrator, Civil Service (Medical): No    Lack of Transportation (Non-Medical): No  Physical Activity: Not on file  Stress: Not on file  Social Connections: Unknown (08/06/2021)   Received from [facility]   Social Network    Social Network: Not on file  Intimate Partner Violence: Not At Risk (07/22/2023)   Humiliation, Afraid, Rape, and Kick questionnaire    Fear of Current or Ex-Partner: No    Emotionally Abused: No    Physically Abused: No    Sexually Abused: No    Review of Systems  Gastrointestinal:  Positive for abdominal pain and constipation.  Genitourinary:  Positive for dysuria, frequency and urgency.  All other systems reviewed and are negative.      Objective    BP (!) 157/79   Pulse 83   Temp 98.2 F (36.8 C) (Oral)   Resp 18   Ht 5\' 1"  (1.549  m)   Wt 190 lb 3.2 oz (86.3 kg)   SpO2 100%   BMI 35.94 kg/m   Physical Exam Vitals and nursing note reviewed.  Constitutional:      Appearance: Normal appearance. She is normal weight.  HENT:     Head: Normocephalic and atraumatic.     Right Ear: External ear normal.     Left Ear: External ear normal.     Nose: Nose normal.     Mouth/Throat:     Mouth: Mucous membranes are moist.     Pharynx: Oropharynx is clear.  Eyes:     Conjunctiva/sclera: Conjunctivae normal.     Pupils: Pupils are equal, round, and reactive to light.  Cardiovascular:     Rate and Rhythm: Normal rate.  Pulmonary:     Effort: Pulmonary effort is normal.  Abdominal:     General: Abdomen is flat. Bowel sounds are normal.     Hernia: A hernia is present.  Skin:    General: Skin is warm.     Capillary Refill: Capillary refill takes less than 2 seconds.  Neurological:     General: No focal deficit present.     Mental Status: She is alert and oriented to person, place, and time. Mental status is at baseline.  Psychiatric:        Mood and Affect: Mood normal.        Behavior: Behavior normal.        Thought Content: Thought content normal.        Judgment: Judgment normal.       Assessment & Plan:   Problem List Items Addressed This Visit       Cardiovascular and Mediastinum   Essential hypertension   Other Visit Diagnoses       Encounter to establish care with new doctor    -  Primary     Dysuria       Relevant Medications   ciprofloxacin (CIPRO) 250 MG tablet   Other Relevant Orders   POCT URINALYSIS DIP (CLINITEK) (Completed)   Urine Culture      Encounter to establish care with new doctor  Essential hypertension  Dysuria -     POCT URINALYSIS DIP (CLINITEK) -     Urine Culture -     Ciprofloxacin HCl; Take 1 tablet (250 mg total) by mouth 2 (two) times daily for 3 days.  Dispense: 6 tablet; Refill: 0  Grade I hemorrhoids -     Hydrocortisone (Perianal); Place 1 Application  rectally 2 (two) times daily.  Dispense: 30 g; Refill: 0  Left lower quadrant abdominal pain -     DG  Abd 2 Views; Future  Pt with urinary symptoms for months. To send for culture. Treat with cipro x 3 days. If no better and culture negative, refer to urogyn. Suspect urinary incontinence? Pt with LLQ and ventral hernia, send for xrays. May be constipation causing her llq pain? Also experiences hemorrhoids. Send in steroid cream.  No follow-ups on file.   Manette Section, MD

## 2023-07-24 LAB — URINE CULTURE: Organism ID, Bacteria: NO GROWTH

## 2023-08-05 ENCOUNTER — Ambulatory Visit (INDEPENDENT_AMBULATORY_CARE_PROVIDER_SITE_OTHER): Payer: Medicare (Managed Care) | Admitting: Family Medicine

## 2023-08-05 ENCOUNTER — Ambulatory Visit: Payer: Medicare (Managed Care)

## 2023-08-05 ENCOUNTER — Encounter: Payer: Self-pay | Admitting: Family Medicine

## 2023-08-05 VITALS — BP 158/78 | HR 77 | Temp 97.6°F | Resp 18 | Ht 61.0 in | Wt 192.0 lb

## 2023-08-05 DIAGNOSIS — R053 Chronic cough: Secondary | ICD-10-CM | POA: Diagnosis not present

## 2023-08-05 DIAGNOSIS — K2270 Barrett's esophagus without dysplasia: Secondary | ICD-10-CM

## 2023-08-05 DIAGNOSIS — K219 Gastro-esophageal reflux disease without esophagitis: Secondary | ICD-10-CM | POA: Diagnosis not present

## 2023-08-05 NOTE — Patient Instructions (Signed)
  [facility] [place]  [address]  [address]  [place], [place] [address]  [phone]

## 2023-08-05 NOTE — Progress Notes (Signed)
 Established Patient Office Visit  Subjective   Patient ID: Dawn Cuevas, female    DOB: [birth year]  Age: 68 y.o. MRN: [id number]  Chief Complaint  Patient presents with   Follow-up    Patient is here for a 2 week follow up, and she also thinks she may have Bronchitis .    HPI  Pt here for 2 week follow up of dysuria and rectal pain. She was given Cipro  for her urine symptoms along with sending a urine culture. The urine culture was negative. She says her urinary symptoms has improved.  She reports cough at night. This has been present since the last 3 months. She says feels like something stuck in her throat. She has hx of GERD and taking Protonix . She has hx of barrett's esophagus based on previous EGD. Pt asked if she can have test for bronchitis.    Review of Systems  Respiratory:  Positive for cough.   All other systems reviewed and are negative.     Objective:     BP (!) 158/78   Pulse 77   Temp 97.6 F (36.4 C) (Oral)   Resp 18   Ht 5\' 1"  (1.549 m)   Wt 192 lb (87.1 kg)   SpO2 98%   BMI 36.28 kg/m  BP Readings from Last 3 Encounters:  08/05/23 (!) 158/78  07/22/23 (!) 157/79  11/08/22 125/75      Physical Exam Vitals and nursing note reviewed.  Constitutional:      Appearance: Normal appearance. She is normal weight.  HENT:     Head: Normocephalic and atraumatic.     Right Ear: External ear normal.     Left Ear: External ear normal.     Nose: Nose normal.     Mouth/Throat:     Mouth: Mucous membranes are moist.     Pharynx: Oropharynx is clear.  Eyes:     Conjunctiva/sclera: Conjunctivae normal.     Pupils: Pupils are equal, round, and reactive to light.  Cardiovascular:     Rate and Rhythm: Normal rate.  Pulmonary:     Effort: Pulmonary effort is normal.  Skin:    General: Skin is warm.     Capillary Refill: Capillary refill takes less than 2 seconds.  Neurological:     General: No focal deficit present.     Mental Status: She is  alert and oriented to person, place, and time. Mental status is at baseline.  Psychiatric:        Mood and Affect: Mood normal.        Behavior: Behavior normal.        Thought Content: Thought content normal.        Judgment: Judgment normal.     No results found for any visits on 08/05/23.     The 10-year ASCVD risk score (Arnett DK, et al., 2019) is: 17.6%    Assessment & Plan:   Problem List Items Addressed This Visit       Digestive   GERD without esophagitis     Other   Chronic cough - Primary   Relevant Orders   DG Chest 2 View   Other Visit Diagnoses       Barrett's esophagus without dysplasia         Chronic cough -     DG Chest 2 View; Future  GERD without esophagitis  Barrett's esophagus without dysplasia   Discussed with pt that I suspect her cough is from  her hx of GERD and Barrett's esophagus . Review of notes from GI in Nov advised pt to take Protonix  and follow up if worsening condition. She never saw them. Advised to follow up for possible EGD. To get CXR now but if normal. Advised pt this is likely the reason for her cough.  Return in about 6 months (around 02/05/2024) for Chronic condition follow up.    Manette Section, MD

## 2023-08-06 ENCOUNTER — Ambulatory Visit: Payer: Self-pay | Admitting: Family Medicine

## 2023-08-13 ENCOUNTER — Other Ambulatory Visit: Payer: Self-pay | Admitting: Family Medicine

## 2023-08-13 DIAGNOSIS — E785 Hyperlipidemia, unspecified: Secondary | ICD-10-CM

## 2023-08-20 ENCOUNTER — Other Ambulatory Visit: Payer: Self-pay | Admitting: Family Medicine

## 2023-08-20 DIAGNOSIS — E785 Hyperlipidemia, unspecified: Secondary | ICD-10-CM

## 2023-09-01 ENCOUNTER — Other Ambulatory Visit: Payer: Self-pay | Admitting: Family Medicine

## 2023-09-01 DIAGNOSIS — Z1231 Encounter for screening mammogram for malignant neoplasm of breast: Secondary | ICD-10-CM

## 2023-09-19 ENCOUNTER — Ambulatory Visit
Admission: RE | Admit: 2023-09-19 | Discharge: 2023-09-19 | Disposition: A | Discharge status: Home / Self Care | Payer: Medicare (Managed Care) | Source: Ambulatory Visit | Attending: Family Medicine | Admitting: Family Medicine

## 2023-09-19 DIAGNOSIS — Z1231 Encounter for screening mammogram for malignant neoplasm of breast: Secondary | ICD-10-CM | POA: Diagnosis not present

## 2023-09-20 ENCOUNTER — Other Ambulatory Visit: Payer: Self-pay | Admitting: Family Medicine

## 2023-09-20 DIAGNOSIS — E785 Hyperlipidemia, unspecified: Secondary | ICD-10-CM

## 2023-09-25 ENCOUNTER — Other Ambulatory Visit: Payer: Self-pay | Admitting: Family Medicine

## 2023-09-25 DIAGNOSIS — R928 Other abnormal and inconclusive findings on diagnostic imaging of breast: Secondary | ICD-10-CM

## 2023-10-10 ENCOUNTER — Other Ambulatory Visit: Payer: Self-pay | Admitting: Family Medicine

## 2023-10-10 DIAGNOSIS — K219 Gastro-esophageal reflux disease without esophagitis: Secondary | ICD-10-CM

## 2023-10-15 ENCOUNTER — Other Ambulatory Visit: Payer: Self-pay | Admitting: Family Medicine

## 2023-10-15 DIAGNOSIS — K219 Gastro-esophageal reflux disease without esophagitis: Secondary | ICD-10-CM

## 2023-10-15 DIAGNOSIS — E785 Hyperlipidemia, unspecified: Secondary | ICD-10-CM

## 2023-10-15 MED ORDER — PANTOPRAZOLE SODIUM 40 MG PO TBEC: 40.0000 mg | DELAYED_RELEASE_TABLET | Freq: Every day | ORAL | 3 refills | Status: AC

## 2023-10-15 MED ORDER — ROSUVASTATIN CALCIUM 40 MG PO TABS
40.0000 mg | ORAL_TABLET | Freq: Every day | ORAL | 0 refills | Status: DC
Start: 2023-10-15 — End: 2023-12-22

## 2023-10-15 NOTE — Telephone Encounter (Unsigned)
 Copied from CRM [id number]. Topic: Clinical - Medication Refill >> Oct 15, 2023  9:55 AM Chiquita SQUIBB wrote: Medication:  pantoprazole  pantoprazole  (PROTONIX ) 40 MG tablet   rosuvastatin  rosuvastatin  (CRESTOR ) 40 MG tablet  Requesting a 90 day refill   Has the patient contacted their pharmacy? Yes- Pharmacy calling in  (Agent: If no, request that the patient contact the pharmacy for the refill. If patient does not wish to contact the pharmacy document the reason why and proceed with request.) (Agent: If yes, when and what did the pharmacy advise?)  This is the patient's preferred pharmacy:  [facility] [place], [place] - [address] [place] [address] Phone: [phone] Fax: [phone]  Is this the correct pharmacy for this prescription? Yes If no, delete pharmacy and type the correct one.   Has the prescription been filled recently? No  Is the patient out of the medication? Yes  Has the patient been seen for an appointment in the last year OR does the patient have an upcoming appointment? Yes  Can we respond through MyChart? No  Agent: Please be advised that Rx refills may take up to 3 business days. We ask that you follow-up with your pharmacy.

## 2023-10-20 ENCOUNTER — Ambulatory Visit
Admission: RE | Admit: 2023-10-20 | Discharge: 2023-10-20 | Disposition: A | Discharge status: Home / Self Care | Payer: Medicare (Managed Care) | Source: Ambulatory Visit | Attending: Family Medicine | Admitting: Family Medicine

## 2023-10-20 DIAGNOSIS — R928 Other abnormal and inconclusive findings on diagnostic imaging of breast: Secondary | ICD-10-CM

## 2023-10-20 DIAGNOSIS — R921 Mammographic calcification found on diagnostic imaging of breast: Secondary | ICD-10-CM | POA: Diagnosis not present

## 2023-10-21 ENCOUNTER — Other Ambulatory Visit: Payer: Self-pay | Admitting: Family Medicine

## 2023-10-21 DIAGNOSIS — R921 Mammographic calcification found on diagnostic imaging of breast: Secondary | ICD-10-CM

## 2023-11-21 ENCOUNTER — Ambulatory Visit: Payer: Self-pay | Admitting: *Deleted

## 2023-11-21 NOTE — Telephone Encounter (Signed)
 FYI Only or Action Required?: Action required by provider: request for appointment and request for earlier appt today if possible instead of 11/27/23.  Patient was last seen in primary care on 08/05/2023 by Colette Torrence GRADE, MD.  Called Nurse Triage reporting Urinary Frequency.  Symptoms began several weeks ago.  Interventions attempted: Nothing.  Symptoms are: gradually worsening.  Triage Disposition: See Physician Within 24 Hours  Patient/caregiver understands and will follow disposition?: Yes      Copied from CRM [id number]. Topic: Clinical - Red Word Triage >> Nov 21, 2023 10:29 AM Willma SAUNDERS wrote: Red Word that prompted transfer to Nurse Triage: Patients states for the past two weeks she has had an increase in frequency and burning when urinating. Also states she has a dry mouth. Reason for Disposition  Urinating more frequently than usual (i.e., frequency) OR new-onset of the feeling of an urgent need to urinate (i.e., urgency)  Answer Assessment - Initial Assessment Questions No available appt within 24 hours. Appt scheduled for 11/27/23. Please advise if earlier appt can be scheduled. Patient requesting today after 4 pm if possible. Recommended UC if sx worsen. Patient daughter reports patient concerned due to language barrier going to UC. Patient daughter not living in area of patient to assist with interpreting. Reviewed interpreter line is available to provider and others to help patient understand care if needed.      1. SYMPTOM: What's the main symptom you're concerned about? (e.g., frequency, incontinence)     Frequency , burning per patient daughter on DPR  2. ONSET: When did the  sx    start?     2 weeks  3. PAIN: Is there any pain? If Yes, ask: How bad is it? (Scale: 1-10; mild, moderate, severe)     Urgency and sometimes comes out. No pain just burning  4. CAUSE: What do you think is causing the symptoms?     UTI 5. OTHER SYMPTOMS: Do you have any  other symptoms? (e.g., blood in urine, fever, flank pain, pain with urination)     Urinary frequency, night more frequent urination . burning no odor 6. PREGNANCY: Is there any chance you are pregnant? When was your last menstrual period?     na  Protocols used: Urinary Symptoms-A-AH

## 2023-11-27 ENCOUNTER — Ambulatory Visit: Payer: Medicare (Managed Care) | Admitting: Family Medicine

## 2023-11-27 ENCOUNTER — Encounter: Payer: Self-pay | Admitting: Family Medicine

## 2023-11-27 VITALS — BP 140/90 | HR 78 | Temp 99.3°F | Ht 61.0 in | Wt 195.0 lb

## 2023-11-27 DIAGNOSIS — J0110 Acute frontal sinusitis, unspecified: Secondary | ICD-10-CM

## 2023-11-27 DIAGNOSIS — R350 Frequency of micturition: Secondary | ICD-10-CM

## 2023-11-27 DIAGNOSIS — I10 Essential (primary) hypertension: Secondary | ICD-10-CM

## 2023-11-27 DIAGNOSIS — Z23 Encounter for immunization: Secondary | ICD-10-CM

## 2023-11-27 DIAGNOSIS — Z130 Encounter for screening for diseases of the blood and blood-forming organs and certain disorders involving the immune mechanism: Secondary | ICD-10-CM | POA: Insufficient documentation

## 2023-11-27 LAB — POCT URINALYSIS DIP (CLINITEK)
Bilirubin, UA: NEGATIVE
Blood, UA: NEGATIVE
Glucose, UA: NEGATIVE mg/dL
Ketones, POC UA: NEGATIVE mg/dL
Nitrite, UA: NEGATIVE
Spec Grav, UA: 1.01 (ref 1.010–1.025)
Urobilinogen, UA: 0.2 U/dL
pH, UA: 7.5 (ref 5.0–8.0)

## 2023-11-27 MED ORDER — AMOXICILLIN-POT CLAVULANATE 875-125 MG PO TABS
1.0000 | ORAL_TABLET | Freq: Two times a day (BID) | ORAL | 0 refills | Status: DC
Start: 2023-11-27 — End: 2024-02-05

## 2023-11-27 NOTE — Assessment & Plan Note (Signed)
 Influenza high dose given today.

## 2023-11-27 NOTE — Assessment & Plan Note (Signed)
 Has hypertension. BP elevated in office today. She reports it is likely due to not feeling well. She will monitor BP at home and follow-up with her PCP if it does not return to normal.

## 2023-11-27 NOTE — Assessment & Plan Note (Signed)
 Urinary frequency and burning for 3 weeks. POC urine shows small leukocytes. Will send for culture. Augmentin  for sinus infection will treat possible UTI.

## 2023-11-27 NOTE — Assessment & Plan Note (Signed)
 Sinus pain and pressure with throat feeling inflamed for 3 weeks. There is frontal sinus pain with palpation. Symptoms are consistent with acute sinusitis. Augmentin  8750125 mg BID for 10 days. Sinus rinses. Follow-up with PCP if symptoms do not improve.

## 2023-11-27 NOTE — Progress Notes (Signed)
 Acute Office Visit  Subjective:     Patient ID: Dawn Cuevas, female    DOB: 1955/08/14, 68 y.o.   MRN: [id number]  Chief Complaint  Patient presents with   Urine Frequency    About three weeks and tonsils feel inflamed Would like the flu shot today  Interpreter services in place during visit.   HPI Patient is in today for urinary frequency and Urinary burning. No fever. No flank pain. Symptoms present for 3 weeks. POC urine with small leukocytes.  Will send for culture.   Tonsils feel inflamed: Symptoms started 3 weeks ago when she turned the A/C back on. Warm teas with ginger. Helping some. No fever. Sinus pain and pressure.   Vaccine: Flu vaccine today.     ROS      Objective:    BP (!) 140/90 Comment: will check at home and inform PCP if does not return to normal  Pulse 78   Temp 99.3 F (37.4 C) (Oral)   Ht 5' 1 (1.549 m)   Wt 195 lb (88.5 kg)   SpO2 97%   BMI 36.84 kg/m    Physical Exam Vitals and nursing note reviewed.  Constitutional:      General: She is not in acute distress.    Appearance: Normal appearance. She is not ill-appearing.  HENT:     Right Ear: Tympanic membrane normal.     Left Ear: Tympanic membrane normal.     Nose:     Right Turbinates: Swollen.     Left Turbinates: Swollen.     Right Sinus: Frontal sinus tenderness present.     Left Sinus: Frontal sinus tenderness present.     Mouth/Throat:     Pharynx: Uvula midline. No oropharyngeal exudate or posterior oropharyngeal erythema.  Cardiovascular:     Rate and Rhythm: Regular rhythm.     Heart sounds: Normal heart sounds.  Pulmonary:     Effort: Pulmonary effort is normal.     Breath sounds: Normal breath sounds.  Abdominal:     Tenderness: There is no right CVA tenderness or left CVA tenderness.  Skin:    General: Skin is warm and dry.  Neurological:     General: No focal deficit present.     Mental Status: She is alert. Mental status is at baseline.   Psychiatric:        Mood and Affect: Mood normal.        Behavior: Behavior normal.        Thought Content: Thought content normal.        Judgment: Judgment normal.     No results found for any visits on 11/27/23.      Assessment & Plan:   Problem List Items Addressed This Visit     Elevated blood pressure reading in office with diagnosis of hypertension   Has hypertension. BP elevated in office today. She reports it is likely due to not feeling well. She will monitor BP at home and follow-up with her PCP if it does not return to normal.       Urinary frequency - Primary   Urinary frequency and burning for 3 weeks. POC urine shows small leukocytes. Will send for culture. Augmentin  for sinus infection will treat possible UTI.       Relevant Orders   POCT URINALYSIS DIP (CLINITEK)   Urine Culture   Encounter for administration of vaccine   Influenza high dose given today.       Relevant  Orders   Flu vaccine HIGH DOSE PF(Fluzone Trivalent)   Acute non-recurrent frontal sinusitis   Sinus pain and pressure with throat feeling inflamed for 3 weeks. There is frontal sinus pain with palpation. Symptoms are consistent with acute sinusitis. Augmentin  8750125 mg BID for 10 days. Sinus rinses. Follow-up with PCP if symptoms do not improve.       Relevant Medications   amoxicillin -clavulanate (AUGMENTIN ) 875-125 MG tablet    Meds ordered this encounter  Medications   amoxicillin -clavulanate (AUGMENTIN ) 875-125 MG tablet    Sig: Take 1 tablet by mouth 2 (two) times daily.    Dispense:  20 tablet    Refill:  0    Supervising Provider:   METHENEY, CATHERINE D [[id number]]  Agrees with plan of care discussed.  Questions answered.    Return for follow-up with PCP for blood pressure .  Dawn JONELLE Brownie, FNP

## 2023-11-29 LAB — URINE CULTURE

## 2023-12-01 ENCOUNTER — Ambulatory Visit: Payer: Self-pay | Admitting: Family Medicine

## 2023-12-05 ENCOUNTER — Telehealth: Payer: Self-pay

## 2023-12-05 DIAGNOSIS — K219 Gastro-esophageal reflux disease without esophagitis: Secondary | ICD-10-CM

## 2023-12-05 DIAGNOSIS — K2270 Barrett's esophagus without dysplasia: Secondary | ICD-10-CM

## 2023-12-05 NOTE — Telephone Encounter (Signed)
 Spoke with patient via interpreter services explained to patient that provider did not send in a referral to GI and wanted to know what the indication was for. Patient stated that [facility] where she would like to go stated to her that she needed a referral. Informed patient that she needed to schedule an appointment with provider to discuss further. Patient voiced an understanding without any concerns

## 2023-12-05 NOTE — Telephone Encounter (Signed)
 Please Advise   Copied from CRM [id number]. Topic: Referral - Question >> Dec 04, 2023  4:19 PM Sasha M wrote: Reason for CRM: pt would like her current referral to gastro to instead be sent to:  [facility]wen[facility] Dyjyp[facility] [address] [place], [place] [address], [address] Floor PHONE: [phone] Fax number [phone]  Call [phone] to speak to daughter Jackolyn, if needed

## 2023-12-10 NOTE — Telephone Encounter (Signed)
 Copied from CRM #[id number]. Topic: Referral - Question >> Dec 09, 2023  4:40 PM Rea BROCKS wrote: Reason for CRM: [facility]ana Dyjypa [facility] [address] [place], [place] [address], [address] Floor PHONE: [phone] Fax number [phone]   Call [phone] to speak to daughter Jackolyn, if needed    Patient's daughter, Jackolyn is calling back to see if referral can be put in for patient (mom). Patient is experiencing stomach issues and they are familiar with this provider. Looked at appointments for Dr. Colette and she is booked out until January. Patient does not want to wait that long for an appointment to get the referral.   [phone] (M)

## 2023-12-22 ENCOUNTER — Other Ambulatory Visit: Payer: Self-pay | Admitting: Family Medicine

## 2023-12-22 DIAGNOSIS — E785 Hyperlipidemia, unspecified: Secondary | ICD-10-CM

## 2023-12-31 ENCOUNTER — Other Ambulatory Visit: Payer: Self-pay | Admitting: Family Medicine

## 2023-12-31 DIAGNOSIS — Z79899 Other long term (current) drug therapy: Secondary | ICD-10-CM | POA: Diagnosis not present

## 2023-12-31 DIAGNOSIS — K2270 Barrett's esophagus without dysplasia: Secondary | ICD-10-CM | POA: Diagnosis not present

## 2023-12-31 DIAGNOSIS — I10 Essential (primary) hypertension: Secondary | ICD-10-CM

## 2024-01-07 ENCOUNTER — Other Ambulatory Visit: Payer: Self-pay | Admitting: Family Medicine

## 2024-01-07 DIAGNOSIS — I10 Essential (primary) hypertension: Secondary | ICD-10-CM

## 2024-01-07 NOTE — Telephone Encounter (Unsigned)
 Copied from CRM [id number]. Topic: Clinical - Medication Refill >> Jan 07, 2024  4:09 PM Kevelyn M wrote: Medication: amLODipine -valsartan  (EXFORGE ) 10-320 MG tablet [Pharmacy Med Name: amlodipine  10 mg-valsartan  320 mg tablet]  Has the patient contacted their pharmacy? Yes (Agent: If no, request that the patient contact the pharmacy for the refill. If patient does not wish to contact the pharmacy document the reason why and proceed with request.) (Agent: If yes, when and what did the pharmacy advise?)  This is the patient's preferred pharmacy:  [facility] [place], [place] - [address] [place] [address] Phone: [phone] Fax: [phone]  Is this the correct pharmacy for this prescription? Yes If no, delete pharmacy and type the correct one.   Has the prescription been filled recently? No  Is the patient out of the medication? Yes  Has the patient been seen for an appointment in the last year OR does the patient have an upcoming appointment? Yes  Can we respond through MyChart? No  Agent: Please be advised that Rx refills may take up to 3 business days. We ask that you follow-up with your pharmacy.

## 2024-01-08 ENCOUNTER — Other Ambulatory Visit: Payer: Self-pay | Admitting: Family Medicine

## 2024-01-08 DIAGNOSIS — I10 Essential (primary) hypertension: Secondary | ICD-10-CM

## 2024-01-08 MED ORDER — AMLODIPINE BESYLATE-VALSARTAN 10-320 MG PO TABS: 1.0000 | ORAL_TABLET | Freq: Every day | ORAL | 0 refills | Status: DC

## 2024-01-09 ENCOUNTER — Telehealth: Payer: Self-pay

## 2024-01-09 NOTE — Telephone Encounter (Signed)
 Prior auth for: AMLODIPINE -VALSARTAN  Determination: PENDING (Patient authentication failed. The patient's zip code and/or phone number provided do not match our records. Left a vm msg in Spanish for the patient to contact their insurance carrier and verified her demographics. Unable to proceed with prior authorization for the rx). Auth #: [id number] Valid from: n/a Reason: n/a  The patient has been notified via a telephone call.

## 2024-01-22 NOTE — Progress Notes (Signed)
 Dawn Cuevas                                          MRN: [id number]   01/22/2024   The [facility] Quality Team Specialist reviewed this patient medical record for the purposes of chart review for care gap closure. The following were reviewed: chart review for care gap closure-controlling blood pressure.    [facility] Quality Team

## 2024-01-26 ENCOUNTER — Telehealth: Payer: Self-pay

## 2024-01-26 NOTE — Telephone Encounter (Signed)
 Prior auth for: AMLODIPINE -VALSARTAN  Determination: PENDING (Patient authentication failed. The patient's zip code and/or phone number provided do not match our records. Left a vm msg in Spanish for the patient to contact their insurance carrier and verified her demographics. Unable to proceed with prior authorization for the rx). Auth #BETHA HIGHMAN / [id number] Valid from: n/a Reason: n/a   The patient has been notified via a telephone call.

## 2024-02-03 ENCOUNTER — Other Ambulatory Visit: Payer: Self-pay | Admitting: Family Medicine

## 2024-02-03 DIAGNOSIS — I10 Essential (primary) hypertension: Secondary | ICD-10-CM

## 2024-02-03 DIAGNOSIS — K2270 Barrett's esophagus without dysplasia: Secondary | ICD-10-CM | POA: Diagnosis not present

## 2024-02-05 ENCOUNTER — Encounter: Payer: Self-pay | Admitting: Family Medicine

## 2024-02-05 ENCOUNTER — Ambulatory Visit: Payer: Medicare (Managed Care) | Admitting: Family Medicine

## 2024-02-05 VITALS — BP 134/76 | HR 73 | Temp 98.0°F | Ht 61.0 in | Wt 194.0 lb

## 2024-02-05 DIAGNOSIS — I10 Essential (primary) hypertension: Secondary | ICD-10-CM

## 2024-02-05 DIAGNOSIS — E785 Hyperlipidemia, unspecified: Secondary | ICD-10-CM | POA: Diagnosis not present

## 2024-02-05 MED ORDER — ROSUVASTATIN CALCIUM 40 MG PO TABS: 40.0000 mg | ORAL_TABLET | Freq: Every day | ORAL | 0 refills | Status: AC

## 2024-02-05 MED ORDER — HYDROCHLOROTHIAZIDE 25 MG PO TABS: 25.0000 mg | ORAL_TABLET | Freq: Every day | ORAL | 0 refills | Status: AC

## 2024-02-05 MED ORDER — AMLODIPINE BESYLATE-VALSARTAN 10-320 MG PO TABS: 1.0000 | ORAL_TABLET | Freq: Every day | ORAL | 0 refills | Status: AC

## 2024-02-05 NOTE — Progress Notes (Signed)
   Established Patient Office Visit  Subjective   Patient ID: Dawn Cuevas, female    DOB: 1955-09-04  Age: 68 y.o. MRN: [id number]  Chief Complaint  Patient presents with   Hypertension    6 month follow up  Interpreter services in place today.   HPI  HTN: amlodipine -valsartan  10-320 mg daily, hydrochlorothiazide  25 mg daily. Denies chest pain and shortness of breath. Home readings: 140/80 Watches sodium intake.  Improved upon recheck.  Defer BMP until CPE in 2 weeks.  Refill sent.   HLD: rosuvastatin  40 mg daily Tolerates well. No side effects reported.  Lipid panel at CPE in 2 weeks.  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  Reports records will be sent to this office/PCP.  No refills needed.   Review of Systems  Respiratory:  Negative for shortness of breath.   Cardiovascular:  Negative for chest pain.      Objective:     BP 134/76 (Patient Position: Sitting, Cuff Size: Normal)   Pulse 73   Temp 98 F (36.7 C) (Oral)   Ht 5' 1 (1.549 m)   Wt 194 lb (88 kg)   SpO2 98%   BMI 36.66 kg/m    Physical Exam Vitals and nursing note reviewed.  Constitutional:      General: She is not in acute distress.    Appearance: Normal appearance.  Cardiovascular:     Rate and Rhythm: Regular rhythm.     Heart sounds: Normal heart sounds.  Pulmonary:     Effort: Pulmonary effort is normal.     Breath sounds: Normal breath sounds.  Skin:    General: Skin is warm and dry.  Neurological:     General: No focal deficit present.     Mental Status: She is alert. Mental status is at baseline.  Psychiatric:        Mood and Affect: Mood normal.        Behavior: Behavior normal.        Thought Content: Thought content normal.        Judgment: Judgment normal.      No results found for any visits on 02/05/24.    The 10-year ASCVD risk  score (Arnett DK, et al., 2019) is: 13%    Assessment & Plan:   Essential hypertension -     amLODIPine  Besylate-Valsartan ; Take 1 tablet by mouth daily.  Dispense: 90 tablet; Refill: 0 -     hydroCHLOROthiazide ; Take 1 tablet (25 mg total) by mouth daily.  Dispense: 90 tablet; Refill: 0  Hyperlipidemia, unspecified hyperlipidemia type -     Rosuvastatin  Calcium ; Take 1 tablet (40 mg total) by mouth daily.  Dispense: 90 tablet; Refill: 0      Return in about 3 weeks (around 02/26/2024) for CPE with labs.    [name], FNP

## 2024-02-10 ENCOUNTER — Telehealth: Payer: Self-pay

## 2024-02-10 NOTE — Progress Notes (Signed)
   02/10/2024  Patient ID: Dawn Cuevas, female   DOB: 01/15/1956, 68 y.o.   MRN: [id number]  This patient is appearing on a report for being at risk of failing the adherence measure for cholesterol (statin) medications this calendar year.   Medication: rosuvastatin  40mg  daily  Last fill date: 02/05/24 for 90 day supply  Insurance report was not up to date. No action needed at this time.   Dawn Cuevas, PharmD, DPLA

## 2024-02-26 ENCOUNTER — Encounter: Payer: Medicare (Managed Care) | Admitting: Family Medicine

## 2024-03-11 NOTE — Progress Notes (Signed)
 Dawn Cuevas                                          MRN: [id number]   03/11/2024   The [facility] Quality Team Specialist reviewed this patient medical record for the purposes of chart review for care gap closure. The following were reviewed: abstraction for care gap closure-controlling blood pressure.    [facility] Quality Team

## 2024-04-16 ENCOUNTER — Ambulatory Visit: Payer: MEDICARE | Admitting: Podiatry

## 2024-04-16 DIAGNOSIS — M62461 Contracture of muscle, right lower leg: Secondary | ICD-10-CM

## 2024-04-16 DIAGNOSIS — M722 Plantar fascial fibromatosis: Secondary | ICD-10-CM

## 2024-04-16 NOTE — Progress Notes (Signed)
"  °  Subjective:  Patient ID: Dawn Cuevas, female    DOB: [birth year],  MRN: [id number]  Chief Complaint  Patient presents with   Foot Pain    Pain 69 y.o. female presents with the above complaint.  Patient presents with complaint of right plantar midfoot pain.  She states she has been doing this for quite some time her ankle is doing okay.  Denies any other acute complaints would like to discuss treatment options for pain scale 7 out of 10 dull aching nature worse with ambulation and shoe pressure.   Review of Systems: Negative except as noted in the HPI. Denies N/V/F/Ch.  Past Medical History:  Diagnosis Date   Abnormal Pap smear of cervix    Hyperlipemia    Hypertension    Current Medications[1]  Tobacco Use History[2]  Allergies[3] Objective:  There were no vitals filed for this visit. There is no height or weight on file to calculate BMI. Constitutional Well developed. Well nourished.  Vascular Dorsalis pedis pulses palpable bilaterally. Posterior tibial pulses palpable bilaterally. Capillary refill normal to all digits.  No cyanosis or clubbing noted. Pedal hair growth normal.  Neurologic Normal speech. Oriented to person, place, and time. Epicritic sensation to light touch grossly present bilaterally.  Dermatologic Nails well groomed and normal in appearance. No open wounds. No skin lesions.  Orthopedic: Normal joint ROM without pain or crepitus bilaterally. No visible deformities. Tender to palpation at the calcaneal tuber right. No pain with calcaneal squeeze right. Ankle ROM paindiminished range of motion yeah. Silfvers[place]old Test: Negativepositive right.   Radiographs: None  Assessment:   1. Plantar fasciitis of right foot   2. Gastrocnemius equinus of right lower extremity    Plan:  Patient was evaluated and treated and all questions answered.  Plantar Fasciitis, right midfoot with underlying gastrocnemius equinus - XR reviewed as above.  -  Educated on icing and stretching. Instructions given.  - Injection delivered to the plantar fascia as below. - DME: Plantar fascial brace dispensed to support the medial longitudinal arch of the foot and offload pressure from the heel and prevent arch collapse during weightbearing - Pharmacologic management: None  Procedure: Injection Tendon/Ligament Location: Right plantar fascia at the glabrous junction; medial approach. Skin Prep: alcohol Injectate: 0.5 cc 0.5% marcaine plain, 0.5 cc of 1% Lidocaine, 0.5 cc kenalog 10. Disposition: Patient tolerated procedure well. Injection site dressed with a band-aid.  No follow-ups on file.    [1]  Current Outpatient Medications:    amLODipine -valsartan  (EXFORGE ) 10-320 MG tablet, Take 1 tablet by mouth daily., Disp: 90 tablet, Rfl: 0   hydrochlorothiazide  (HYDRODIURIL ) 25 MG tablet, Take 1 tablet (25 mg total) by mouth daily., Disp: 90 tablet, Rfl: 0   pantoprazole  (PROTONIX ) 40 MG tablet, Take 1 tablet (40 mg total) by mouth daily., Disp: 90 tablet, Rfl: 3   rosuvastatin  (CRESTOR ) 40 MG tablet, Take 1 tablet (40 mg total) by mouth daily., Disp: 90 tablet, Rfl: 0 [2]  Social History Tobacco Use  Smoking Status Never   Passive exposure: Never  Smokeless Tobacco Never  [3]  Allergies Allergen Reactions   Aspirin Shortness Of Breath    Other Reaction(s): Other (See Comments)  Pt states that she feels desperate   "

## 2024-04-22 ENCOUNTER — Ambulatory Visit
Admission: RE | Admit: 2024-04-22 | Discharge: 2024-04-22 | Disposition: A | Discharge status: Home / Self Care | Payer: Medicare (Managed Care) | Source: Ambulatory Visit | Attending: Family Medicine | Admitting: Family Medicine

## 2024-04-22 ENCOUNTER — Encounter: Payer: Self-pay | Admitting: Radiation Oncology

## 2024-04-22 DIAGNOSIS — R921 Mammographic calcification found on diagnostic imaging of breast: Secondary | ICD-10-CM
